# Patient Record
Sex: Male | Born: 1945 | Race: White | Hispanic: No | Marital: Single | Smoking: Former smoker
Health system: Southern US, Community
[De-identification: ages and names within clinical notes are randomized; demographics above are authoritative.]

## PROBLEM LIST (undated history)

## (undated) DIAGNOSIS — E119 Type 2 diabetes mellitus without complications: Secondary | ICD-10-CM

## (undated) DIAGNOSIS — I10 Essential (primary) hypertension: Secondary | ICD-10-CM

## (undated) HISTORY — PX: CATARACT EXTRACTION, BILATERAL: SHX1313

---

## 2011-04-17 ENCOUNTER — Emergency Department: Payer: Self-pay | Admitting: Emergency Medicine

## 2013-10-21 ENCOUNTER — Ambulatory Visit: Payer: Self-pay | Admitting: Gastroenterology

## 2015-12-25 ENCOUNTER — Emergency Department
Admission: EM | Admit: 2015-12-25 | Discharge: 2015-12-26 | Disposition: A | Discharge status: Home / Self Care | Payer: Medicare Other | Attending: Emergency Medicine | Admitting: Emergency Medicine

## 2015-12-25 ENCOUNTER — Encounter: Payer: Self-pay | Admitting: Emergency Medicine

## 2015-12-25 DIAGNOSIS — R0602 Shortness of breath: Secondary | ICD-10-CM | POA: Diagnosis not present

## 2015-12-25 DIAGNOSIS — R42 Dizziness and giddiness: Secondary | ICD-10-CM | POA: Diagnosis not present

## 2015-12-25 DIAGNOSIS — E119 Type 2 diabetes mellitus without complications: Secondary | ICD-10-CM | POA: Insufficient documentation

## 2015-12-25 DIAGNOSIS — Y939 Activity, unspecified: Secondary | ICD-10-CM | POA: Diagnosis not present

## 2015-12-25 DIAGNOSIS — W1800XA Striking against unspecified object with subsequent fall, initial encounter: Secondary | ICD-10-CM | POA: Diagnosis not present

## 2015-12-25 DIAGNOSIS — R531 Weakness: Secondary | ICD-10-CM | POA: Insufficient documentation

## 2015-12-25 DIAGNOSIS — S4991XA Unspecified injury of right shoulder and upper arm, initial encounter: Secondary | ICD-10-CM | POA: Diagnosis present

## 2015-12-25 DIAGNOSIS — S42291A Other displaced fracture of upper end of right humerus, initial encounter for closed fracture: Secondary | ICD-10-CM | POA: Diagnosis not present

## 2015-12-25 DIAGNOSIS — Z87891 Personal history of nicotine dependence: Secondary | ICD-10-CM | POA: Diagnosis not present

## 2015-12-25 DIAGNOSIS — Y999 Unspecified external cause status: Secondary | ICD-10-CM | POA: Diagnosis not present

## 2015-12-25 DIAGNOSIS — R05 Cough: Secondary | ICD-10-CM | POA: Insufficient documentation

## 2015-12-25 DIAGNOSIS — Y929 Unspecified place or not applicable: Secondary | ICD-10-CM | POA: Insufficient documentation

## 2015-12-25 DIAGNOSIS — I10 Essential (primary) hypertension: Secondary | ICD-10-CM | POA: Insufficient documentation

## 2015-12-25 HISTORY — DX: Type 2 diabetes mellitus without complications: E11.9

## 2015-12-25 HISTORY — DX: Essential (primary) hypertension: I10

## 2015-12-25 NOTE — ED Triage Notes (Signed)
Pt arrives from home by ems after falling. Pt reports having multiple falls since yesterday. 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 Pt's right eye is bruised and stitches intact. Pt's right arm in bruised and in a sling. Pt is alert and oriented x 4.

## 2015-12-26 ENCOUNTER — Emergency Department: Payer: Medicare Other

## 2015-12-26 LAB — URINALYSIS COMPLETE WITH MICROSCOPIC (ARMC ONLY)
BACTERIA UA: NONE SEEN
BILIRUBIN URINE: NEGATIVE
GLUCOSE, UA: 50 mg/dL — AB
Leukocytes, UA: NEGATIVE
Nitrite: NEGATIVE
Protein, ur: 100 mg/dL — AB
SPECIFIC GRAVITY, URINE: 1.017 (ref 1.005–1.030)
pH: 7 (ref 5.0–8.0)

## 2015-12-26 LAB — COMPREHENSIVE METABOLIC PANEL
ALBUMIN: 3.3 g/dL — AB (ref 3.5–5.0)
ALT: 19 U/L (ref 17–63)
AST: 23 U/L (ref 15–41)
Alkaline Phosphatase: 49 U/L (ref 38–126)
Anion gap: 3 — ABNORMAL LOW (ref 5–15)
BILIRUBIN TOTAL: 1.3 mg/dL — AB (ref 0.3–1.2)
BUN: 17 mg/dL (ref 6–20)
CO2: 33 mmol/L — ABNORMAL HIGH (ref 22–32)
CREATININE: 1.16 mg/dL (ref 0.61–1.24)
Calcium: 8.7 mg/dL — ABNORMAL LOW (ref 8.9–10.3)
Chloride: 100 mmol/L — ABNORMAL LOW (ref 101–111)
GFR calc Af Amer: >60 mL/min (ref >60)
GLUCOSE: 108 mg/dL — AB (ref 65–99)
POTASSIUM: 3.4 mmol/L — AB (ref 3.5–5.1)
Sodium: 136 mmol/L (ref 135–145)
TOTAL PROTEIN: 6.8 g/dL (ref 6.5–8.1)

## 2015-12-26 LAB — CBC
HEMATOCRIT: 38.9 % — AB (ref 40.0–52.0)
HEMOGLOBIN: 13.6 g/dL (ref 13.0–18.0)
MCH: 30.6 pg (ref 26.0–34.0)
MCHC: 34.9 g/dL (ref 32.0–36.0)
MCV: 87.6 fL (ref 80.0–100.0)
Platelets: 127 10*3/uL — ABNORMAL LOW (ref 150–440)
RBC: 4.44 MIL/uL (ref 4.40–5.90)
RDW: 14.2 % (ref 11.5–14.5)
WBC: 8.6 10*3/uL (ref 3.8–10.6)

## 2015-12-26 LAB — TROPONIN I: Troponin I: <0.03 ng/mL (ref <0.03)

## 2015-12-26 MED ORDER — SODIUM CHLORIDE 0.9 % IV BOLUS (SEPSIS)
1000.0000 mL | Freq: Once | INTRAVENOUS | Status: AC
  Administered 2015-12-26: 1000 mL via INTRAVENOUS

## 2015-12-26 NOTE — ED Notes (Signed)
Pt repositioned and given sprite. No other needs expressed.

## 2015-12-26 NOTE — ED Notes (Signed)
Pt ambulated in room and to bathroom without difficulty.

## 2015-12-26 NOTE — ED Provider Notes (Signed)
[facility] Emergency Department Provider Note   ____________________________________________   First MD Initiated Contact with Patient 12/26/15 0014     (approximate)  I have reviewed the triage vital signs and the nursing notes.   HISTORY  Chief Complaint Fall    HPI Adam Irwin is a 70 y.o. male who comes into the hospital today with multiple falls. The patient reports that he was stumbling and falling often today. He states that his mom is unable to care for him and she is 52. He was told it was best for him to come in somewhere where he can be taking care of him until he is better. The patient fell and broke his arm on Saturday and [place]. He was kept overnight at the hospital there to ensure that he didn't have a concussion. He reports that there is no plan for follow-up as they want him to come back to [facility] [place] but he does not want to drive there. He reports that he slid out of the chair onto the floor a few times but couldn't get up due to his broken arm. He reports that he's also had some chest pain and a mild cough for the last few hours. He also has some mild shortness of breath. He called the [place] today but did not receive a call back the patient is here for evaluation   Past Medical History:  Diagnosis Date  . Diabetes mellitus without complication (HCC)   . Hypertension     There are no active problems to display for this patient.   Past Surgical History:  Procedure Laterality Date  . CATARACT EXTRACTION, BILATERAL      Prior to Admission medications   Not on File    Allergies Bee venom and Penicillins  No family history on file.  Social History Social History  Substance Use Topics  . Smoking status: Former Games developer  . Smokeless tobacco: Former Neurosurgeon  . Alcohol use No    Review of Systems Constitutional: No fever/chills Eyes: No visual changes. ENT: No sore throat. Cardiovascular:  chest  pain. Respiratory: cough and shortness of breath. Gastrointestinal: No abdominal pain.  No nausea, no vomiting.  No diarrhea.  No constipation. Genitourinary: Negative for dysuria. Musculoskeletal: Negative for back pain. Skin: Negative for rash. Neurological: Negative for headaches, focal weakness or numbness.  10-point ROS otherwise negative.  ____________________________________________   PHYSICAL EXAM:  VITAL SIGNS: ED Triage Vitals  Enc Vitals Group     BP 12/25/15 2346 (!) 176/102     Pulse Rate 12/25/15 2346 (!) 112     Resp 12/25/15 2346 (!) 24     Temp 12/25/15 2346 99.1 F (37.3 C)     Temp Source 12/25/15 2346 Oral     SpO2 12/25/15 2346 95 %     Weight 12/25/15 2352 200 lb (90.7 kg)     Height 12/25/15 2352 5\' 7"  (1.702 m)     Head Circumference --      Peak Flow --      Pain Score 12/25/15 2353 3     Pain Loc --      Pain Edu? --      Excl. in GC? --     Constitutional: Alert and oriented. Well appearing and in no acute distress. Eyes: Conjunctivae are normal. PERRL. EOMI. Head: Atraumatic. Nose: No congestion/rhinnorhea. Mouth/Throat: Mucous membranes are moist.  Oropharynx non-erythematous. Cardiovascular: Normal rate, regular rhythm. Grossly normal heart sounds.  Good  peripheral circulation. Respiratory: Normal respiratory effort.  No retractions. Lungs CTAB. Gastrointestinal: Soft and nontender. No distention. Positive bowel sounds Musculoskeletal: No lower extremity tenderness nor edema.  Neurologic:  Normal speech and language. Cranial nerves II through XII are grossly intact with no focal motor or neuro deficits Skin:  Skin is warm, dry and intact.  Psychiatric: Mood and affect are normal.   ____________________________________________   LABS (all labs ordered are listed, but only abnormal results are displayed)  Labs Reviewed  CBC - Abnormal; Notable for the following:       Result Value   HCT 38.9 (*)    Platelets 127 (*)    All other  components within normal limits  COMPREHENSIVE METABOLIC PANEL - Abnormal; Notable for the following:    Potassium 3.4 (*)    Chloride 100 (*)    CO2 33 (*)    Glucose, Bld 108 (*)    Calcium 8.7 (*)    Albumin 3.3 (*)    Total Bilirubin 1.3 (*)    Anion gap 3 (*)    All other components within normal limits  URINALYSIS COMPLETEWITH MICROSCOPIC ([facility] ONLY) - Abnormal; Notable for the following:    Color, Urine YELLOW (*)    APPearance CLEAR (*)    Glucose, UA 50 (*)    Ketones, ur TRACE (*)    Hgb urine dipstick 1+ (*)    Protein, ur 100 (*)    Squamous Epithelial / LPF 0-5 (*)    All other components within normal limits  TROPONIN I  TROPONIN I   ____________________________________________  EKG  ED ECG REPORT I, Rebecka ApleyWebster,  Jeanann Balinski P, the attending physician, personally viewed and interpreted this ECG.   Date: 12/25/2015  EKG Time: 2358  Rate: 110  Rhythm: sinus tachycardia  Axis: normal  Intervals:none  ST&T Change: none  ____________________________________________  RADIOLOGY  Chest x-ray CT head ____________________________________________   PROCEDURES  Procedure(s) performed: None  Procedures  Critical Care performed: No  ____________________________________________   INITIAL IMPRESSION / ASSESSMENT AND PLAN / ED COURSE  Pertinent labs & imaging results that were available during my care of the patient were reviewed by me and considered in my medical decision making (see chart for details).  This is a 70 year old male who comes into the hospital today with some unsteadiness, dizziness, falls at home. I will give the patient a liter of normal saline as he is tachycardic and likely has some mild dehydration. I will evaluate the patient with some blood work and a chest x-ray and CT head to determine if there is a cause for his symptoms. I will then reassess the patient.  Clinical Course  Value Comment By Time  DG Chest 2 View No active  cardiopulmonary disease. Rebecka ApleyAllison P Avraham Benish, MD 10/03 0132  CT Head Wo Contrast No acute intracranial abnormalities. Rebecka ApleyAllison P Tasha Diaz, MD 10/03 0423    The patient's imaging studies are negative and his blood work is unremarkable. I did give the patient a liter of normal saline while in the emergency department. He was sleeping while he was here. He is looking for rehabilitation. Due to his insurance he does not qualify for rehabilitation placement nor home health care. Our care manager does feel that the best [facility] for the [facility]. I discussed this with the patient after keeping him an effort to discuss with care management. The patient understands and he  [facility]. He will be discharged home. He was able to walk overnight with a steady gait he did eat and drink as well. ____________________________________________   FINAL CLINICAL IMPRESSION(S) / ED DIAGNOSES  Final diagnoses:  Weakness  Dizziness  Other closed displaced fracture of proximal end of right humerus, initial encounter      NEW MEDICATIONS STARTED DURING THIS VISIT:  New Prescriptions   No medications on file     Note:  This document was prepared using Dragon voice recognition software and may include unintentional dictation errors.    Rebecka Apley, MD 12/26/15 [phone]

## 2017-11-24 ENCOUNTER — Emergency Department (HOSPITAL_COMMUNITY): Payer: Medicare Other

## 2017-11-24 ENCOUNTER — Other Ambulatory Visit: Payer: Self-pay

## 2017-11-24 ENCOUNTER — Emergency Department (HOSPITAL_COMMUNITY)
Admission: EM | Admit: 2017-11-24 | Discharge: 2017-11-25 | Disposition: A | Discharge status: Home / Self Care | Payer: Medicare Other | Attending: Emergency Medicine | Admitting: Emergency Medicine

## 2017-11-24 ENCOUNTER — Encounter (HOSPITAL_COMMUNITY): Payer: Self-pay | Admitting: Radiology

## 2017-11-24 DIAGNOSIS — W108XXA Fall (on) (from) other stairs and steps, initial encounter: Secondary | ICD-10-CM | POA: Diagnosis not present

## 2017-11-24 DIAGNOSIS — I10 Essential (primary) hypertension: Secondary | ICD-10-CM | POA: Insufficient documentation

## 2017-11-24 DIAGNOSIS — S3991XA Unspecified injury of abdomen, initial encounter: Secondary | ICD-10-CM | POA: Diagnosis not present

## 2017-11-24 DIAGNOSIS — Z7982 Long term (current) use of aspirin: Secondary | ICD-10-CM | POA: Diagnosis not present

## 2017-11-24 DIAGNOSIS — Z79899 Other long term (current) drug therapy: Secondary | ICD-10-CM | POA: Diagnosis not present

## 2017-11-24 DIAGNOSIS — R42 Dizziness and giddiness: Secondary | ICD-10-CM | POA: Diagnosis not present

## 2017-11-24 DIAGNOSIS — E875 Hyperkalemia: Secondary | ICD-10-CM

## 2017-11-24 DIAGNOSIS — E119 Type 2 diabetes mellitus without complications: Secondary | ICD-10-CM | POA: Diagnosis not present

## 2017-11-24 DIAGNOSIS — S299XXA Unspecified injury of thorax, initial encounter: Secondary | ICD-10-CM | POA: Diagnosis present

## 2017-11-24 DIAGNOSIS — R531 Weakness: Secondary | ICD-10-CM | POA: Insufficient documentation

## 2017-11-24 DIAGNOSIS — Z7984 Long term (current) use of oral hypoglycemic drugs: Secondary | ICD-10-CM | POA: Diagnosis not present

## 2017-11-24 DIAGNOSIS — Y999 Unspecified external cause status: Secondary | ICD-10-CM | POA: Insufficient documentation

## 2017-11-24 DIAGNOSIS — Y92018 Other place in single-family (private) house as the place of occurrence of the external cause: Secondary | ICD-10-CM | POA: Diagnosis not present

## 2017-11-24 DIAGNOSIS — S2231XA Fracture of one rib, right side, initial encounter for closed fracture: Secondary | ICD-10-CM | POA: Diagnosis not present

## 2017-11-24 DIAGNOSIS — R52 Pain, unspecified: Secondary | ICD-10-CM

## 2017-11-24 DIAGNOSIS — Z87891 Personal history of nicotine dependence: Secondary | ICD-10-CM | POA: Insufficient documentation

## 2017-11-24 DIAGNOSIS — Y9389 Activity, other specified: Secondary | ICD-10-CM | POA: Insufficient documentation

## 2017-11-24 DIAGNOSIS — Y92009 Unspecified place in unspecified non-institutional (private) residence as the place of occurrence of the external cause: Secondary | ICD-10-CM

## 2017-11-24 DIAGNOSIS — W19XXXA Unspecified fall, initial encounter: Secondary | ICD-10-CM

## 2017-11-24 LAB — CBC WITH DIFFERENTIAL/PLATELET
Abs Immature Granulocytes: 0 10*3/uL (ref 0.0–0.1)
BASOS PCT: 1 %
Basophils Absolute: 0.1 10*3/uL (ref 0.0–0.1)
EOS ABS: 0.1 10*3/uL (ref 0.0–0.7)
Eosinophils Relative: 1 %
HCT: 36.7 % — ABNORMAL LOW (ref 39.0–52.0)
Hemoglobin: 12.2 g/dL — ABNORMAL LOW (ref 13.0–17.0)
IMMATURE GRANULOCYTES: 1 %
Lymphocytes Relative: 13 %
Lymphs Abs: 1.1 10*3/uL (ref 0.7–4.0)
MCH: 29.5 pg (ref 26.0–34.0)
MCHC: 33.2 g/dL (ref 30.0–36.0)
MCV: 88.6 fL (ref 78.0–100.0)
MONOS PCT: 6 %
Monocytes Absolute: 0.5 10*3/uL (ref 0.1–1.0)
NEUTROS PCT: 78 %
Neutro Abs: 6.4 10*3/uL (ref 1.7–7.7)
PLATELETS: 194 10*3/uL (ref 150–400)
RBC: 4.14 MIL/uL — ABNORMAL LOW (ref 4.22–5.81)
RDW: 12.8 % (ref 11.5–15.5)
WBC: 8.1 10*3/uL (ref 4.0–10.5)

## 2017-11-24 LAB — COMPREHENSIVE METABOLIC PANEL
ALT: 33 U/L (ref 0–44)
AST: 28 U/L (ref 15–41)
Albumin: 3.3 g/dL — ABNORMAL LOW (ref 3.5–5.0)
Alkaline Phosphatase: 43 U/L (ref 38–126)
Anion gap: 7 (ref 5–15)
BUN: 14 mg/dL (ref 8–23)
CHLORIDE: 103 mmol/L (ref 98–111)
CO2: 26 mmol/L (ref 22–32)
Calcium: 9.1 mg/dL (ref 8.9–10.3)
Creatinine, Ser: 1.65 mg/dL — ABNORMAL HIGH (ref 0.61–1.24)
GFR, EST AFRICAN AMERICAN: 47 mL/min — AB (ref >60)
GFR, EST NON AFRICAN AMERICAN: 40 mL/min — AB (ref >60)
Glucose, Bld: 172 mg/dL — ABNORMAL HIGH (ref 70–99)
POTASSIUM: 5.3 mmol/L — AB (ref 3.5–5.1)
Sodium: 136 mmol/L (ref 135–145)
Total Bilirubin: 0.8 mg/dL (ref 0.3–1.2)
Total Protein: 6 g/dL — ABNORMAL LOW (ref 6.5–8.1)

## 2017-11-24 LAB — URINALYSIS, ROUTINE W REFLEX MICROSCOPIC
Bilirubin Urine: NEGATIVE
Glucose, UA: 50 mg/dL — AB
HGB URINE DIPSTICK: NEGATIVE
KETONES UR: NEGATIVE mg/dL
LEUKOCYTES UA: NEGATIVE
Nitrite: NEGATIVE
PROTEIN: NEGATIVE mg/dL
Specific Gravity, Urine: 1.033 — ABNORMAL HIGH (ref 1.005–1.030)
pH: 7 (ref 5.0–8.0)

## 2017-11-24 LAB — I-STAT CHEM 8, ED
BUN: 16 mg/dL (ref 8–23)
Calcium, Ion: 1.12 mmol/L — ABNORMAL LOW (ref 1.15–1.40)
Chloride: 99 mmol/L (ref 98–111)
Creatinine, Ser: 1.6 mg/dL — ABNORMAL HIGH (ref 0.61–1.24)
Glucose, Bld: 173 mg/dL — ABNORMAL HIGH (ref 70–99)
HEMATOCRIT: 34 % — AB (ref 39.0–52.0)
HEMOGLOBIN: 11.6 g/dL — AB (ref 13.0–17.0)
POTASSIUM: 5 mmol/L (ref 3.5–5.1)
Sodium: 133 mmol/L — ABNORMAL LOW (ref 135–145)
TCO2: 24 mmol/L (ref 22–32)

## 2017-11-24 LAB — I-STAT TROPONIN, ED: TROPONIN I, POC: 0 ng/mL (ref 0.00–0.08)

## 2017-11-24 LAB — CBG MONITORING, ED: Glucose-Capillary: 149 mg/dL — ABNORMAL HIGH (ref 70–99)

## 2017-11-24 MED ORDER — HYDROCODONE-ACETAMINOPHEN 5-325 MG PO TABS
1.0000 | ORAL_TABLET | Freq: Once | ORAL | Status: AC
  Administered 2017-11-24: 1 via ORAL
  Filled 2017-11-24: qty 1

## 2017-11-24 MED ORDER — FENTANYL CITRATE (PF) 100 MCG/2ML IJ SOLN
50.0000 ug | Freq: Once | INTRAMUSCULAR | Status: DC
  Filled 2017-11-24: qty 2

## 2017-11-24 MED ORDER — SODIUM CHLORIDE 0.9 % IV BOLUS
500.0000 mL | Freq: Once | INTRAVENOUS | Status: AC
  Administered 2017-11-24: 500 mL via INTRAVENOUS

## 2017-11-24 MED ORDER — HYDROCODONE-ACETAMINOPHEN 5-325 MG PO TABS: 1.0000 | ORAL_TABLET | Freq: Four times a day (QID) | ORAL | 0 refills | Status: DC | PRN

## 2017-11-24 MED ORDER — FENTANYL CITRATE (PF) 100 MCG/2ML IJ SOLN
25.0000 ug | Freq: Once | INTRAMUSCULAR | Status: DC
  Filled 2017-11-24: qty 2

## 2017-11-24 MED ORDER — IOPAMIDOL (ISOVUE-300) INJECTION 61%
INTRAVENOUS | Status: AC
  Filled 2017-11-24: qty 100

## 2017-11-24 MED ORDER — IOPAMIDOL (ISOVUE-300) INJECTION 61%
100.0000 mL | Freq: Once | INTRAVENOUS | Status: AC | PRN
  Administered 2017-11-24: 100 mL via INTRAVENOUS

## 2017-11-24 NOTE — ED Notes (Signed)
Went in room to get orthostatic vitals. Pt stated he would try. Obtained lying orthostatic vitals. Pt stated he was in to much pain to sit on side of bed or stand up. Pt stated he would try again after he ate.

## 2017-11-24 NOTE — ED Provider Notes (Signed)
[facility] EMERGENCY DEPARTMENT Provider Note   CSN: [id number] Arrival date & time: 11/24/17  1539     History   Chief Complaint Chief Complaint  Patient presents with  . Fall    HPI Adam Irwin is a 72 y.o. male with past medical history of DM 2, hypertension, who presents today for evaluation after a fall.  He reports that he was bringing groceries in when he tripped over his own feet causing him to fall.  He denies striking his head, denies any head pain or neck pain.  With EMS he was reportedly orthostatic, upon standing his blood pressure dropped to 80/50 with diaphoresis and dizziness that resolved with sitting down.  He denies any medication changes.  Denies any chest pain, headache, shortness of breath, nausea abdominal pain or any other prodromal symptoms.  He currently reports that the right side of his chest hurts where he hit it when he fell.  He did not have any pain meds in route by EMS.  His pain is worsened with movement and deep breathing.  HPI  Past Medical History:  Diagnosis Date  . Diabetes mellitus without complication (HCC)   . Hypertension     There are no active problems to display for this patient.   Past Surgical History:  Procedure Laterality Date  . CATARACT EXTRACTION, BILATERAL          Home Medications    Prior to Admission medications   Medication Sig Start Date End Date Taking? Authorizing Provider  albuterol (PROVENTIL HFA;VENTOLIN HFA) 108 (90 Base) MCG/ACT inhaler Inhale 2 puffs into the lungs every 6 (six) hours as needed for wheezing or shortness of breath.   Yes [provider]  amitriptyline (ELAVIL) 25 MG tablet Take 50 mg by mouth at bedtime. For pain   Yes [provider]  amLODipine (NORVASC) 10 MG tablet Take 10 mg by mouth daily. For blood pressure   Yes [provider]  aspirin 81 MG chewable tablet Chew 81 mg by mouth daily. For heart protection   Yes [provider]  calcipotriene (DOVONOX) 0.005 % cream Apply 1 application topically See admin instructions. Apply sparingly topically two times a day as needed for rash, apply to active lesions only   Yes [provider]  carboxymethylcellulose (REFRESH PLUS) 0.5 % SOLN Place 1 drop into both eyes 4 (four) times daily.   Yes [provider]  DULoxetine (CYMBALTA) 60 MG capsule Take 60 mg by mouth daily.   Yes [provider]  EPINEPHrine (EPIPEN 2-PAK) 0.3 mg/0.3 mL IJ SOAJ injection Inject 0.3 mg into the muscle once as needed (severe allergic reaction).   Yes [provider]  glipiZIDE (GLUCOTROL) 5 MG tablet Take 5 mg by mouth 2 (two) times daily before a meal.   Yes [provider]  hydrocerin (EUCERIN) CREA Apply 1 application topically daily. Apply liberal amount topically to dry skin after shower every day   Yes [provider]  ketoconazole (NIZORAL) 2 % shampoo Apply 1 application topically See admin instructions. Apply topically to chest and back and then rinse off - Monday, Wednesday, Friday   Yes [provider]  levothyroxine (SYNTHROID, LEVOTHROID) 150 MCG tablet Take 150 mcg by mouth See admin instructions. Take one tablet (150 mcg) by mouth on Monday, Tuesday, Wednesday, Thursday, Friday (take 175 mcg on Saturday and Sunday) - for thyroid   Yes [provider]  levothyroxine (SYNTHROID, LEVOTHROID) 175 MCG tablet Take  175 mcg by mouth See admin instructions. Take one tablet (175 mcg) by mouth every Saturday and Sunday (take 150 mcg Monday thru Friday) - for thyroid   Yes [provider]  lisinopril (PRINIVIL,ZESTRIL) 40 MG tablet Take 40 mg by mouth daily. For blood pressure   Yes [provider]  loratadine (CLARITIN) 10 MG tablet Take 10 mg by mouth daily.    Yes [provider]  Melatonin 3 MG TABS Take 3 mg by mouth at bedtime.   Yes [provider]  metFORMIN (GLUCOPHAGE) 1000  MG tablet Take 1,000 mg by mouth 2 (two) times daily with a meal. For diabetes   Yes [provider]  metoprolol tartrate (LOPRESSOR) 50 MG tablet Take 50 mg by mouth 2 (two) times daily. For blood pressure   Yes [provider]  mometasone (ASMANEX, 120 METERED DOSES,) 220 MCG/INH inhaler Inhale 1 puff into the lungs daily.    Yes [provider]  omeprazole (PRILOSEC) 20 MG capsule Take 20 mg by mouth daily before breakfast. To control stomach acid   Yes [provider]  pioglitazone (ACTOS) 45 MG tablet Take 45 mg by mouth daily. For diabetes mellitus   Yes [provider]  Pramoxine-HC (HYDROCORTISONE ACE-PRAMOXINE EX) Apply 1 application topically 2 (two) times daily as needed (itchy spots). 2.5%/1% ointment per [facility]   Yes [provider]  simvastatin (ZOCOR) 40 MG tablet Take 20 mg by mouth at bedtime. For cholesterol   Yes [provider]  Tiotropium Bromide-Olodaterol (STIOLTO RESPIMAT) 2.5-2.5 MCG/ACT AERS Inhale 2 puffs into the lungs at bedtime.    Yes [provider]  HYDROcodone-acetaminophen (NORCO/VICODIN) 5-325 MG tablet Take 1 tablet by mouth every 6 (six) hours as needed. 11/24/17   Cristina Gong, PA-C    Family History No family history on file.  Social History Social History   Tobacco Use  . Smoking status: Former Games developer  . Smokeless tobacco: Former Engineer, water Use Topics  . Alcohol use: No  . Drug use: Not on file     Allergies   Bee venom and Penicillins   Review of Systems Review of Systems  Constitutional: Negative for chills, fatigue and fever.  HENT: Negative for congestion.   Respiratory: Negative for shortness of breath.   Cardiovascular: Positive for chest pain (Only after falling).  Gastrointestinal: Positive for abdominal pain. Negative for diarrhea, nausea and vomiting.  Genitourinary: Positive for flank pain.  Musculoskeletal: Positive for back pain. Negative for neck  pain.  Skin: Negative for wound.  Neurological: Positive for weakness and light-headedness. Negative for dizziness, syncope, speech difficulty and headaches.  All other systems reviewed and are negative.    Physical Exam Updated Vital Signs BP (!) 114/93 (BP Location: Right Arm)   Pulse (!) 104   Temp 98.5 F (36.9 C) (Oral)   Resp 18   Ht 5\' 7"  (1.702 m)   Wt 83.9 kg   SpO2 96%   BMI 28.98 kg/m   Physical Exam  Constitutional: He is oriented to person, place, and time. He appears well-developed and well-nourished.  HENT:  Head: Normocephalic and atraumatic.  Mouth/Throat: Oropharynx is clear and moist.  Eyes: Pupils are equal, round, and reactive to light. Conjunctivae are normal.  Neck:  No midline tenderness to palpation.  Trachea is not deviated.  Cardiovascular: Normal rate, regular rhythm, normal heart sounds and intact distal pulses.  No murmur heard. Pulmonary/Chest: Effort normal and breath sounds normal. No respiratory distress. He  exhibits tenderness.  There is tenderness to palpation over the right lateral chest wall.  There is no obvious crepitus.  Lung sounds are slightly decreased over the right lower fields.  There is ecchymosis over the right posterior lower chest/upper abdomen.  Abdominal: Soft. Bowel sounds are normal. He exhibits no distension. There is no tenderness. There is no guarding.  Musculoskeletal: He exhibits no edema or deformity.  Is able to elevate bilateral legs off the bed.  Neurological: He is alert and oriented to person, place, and time.  Skin: Skin is warm and dry.  Psychiatric: He has a normal mood and affect.  Nursing note and vitals reviewed.    ED Treatments / Results  Labs (all labs ordered are listed, but only abnormal results are displayed) Labs Reviewed  CBC WITH DIFFERENTIAL/PLATELET - Abnormal; Notable for the following components:      Result Value   RBC 4.14 (*)    Hemoglobin 12.2 (*)    HCT 36.7 (*)    All other  components within normal limits  COMPREHENSIVE METABOLIC PANEL - Abnormal; Notable for the following components:   Potassium 5.3 (*)    Glucose, Bld 172 (*)    Creatinine, Ser 1.65 (*)    Total Protein 6.0 (*)    Albumin 3.3 (*)    GFR calc non Af Amer 40 (*)    GFR calc Af Amer 47 (*)    All other components within normal limits  URINALYSIS, ROUTINE W REFLEX MICROSCOPIC - Abnormal; Notable for the following components:   Specific Gravity, Urine 1.033 (*)    Glucose, UA 50 (*)    All other components within normal limits  CBG MONITORING, ED - Abnormal; Notable for the following components:   Glucose-Capillary 149 (*)    All other components within normal limits  I-STAT CHEM 8, ED - Abnormal; Notable for the following components:   Sodium 133 (*)    Creatinine, Ser 1.60 (*)    Glucose, Bld 173 (*)    Calcium, Ion 1.12 (*)    Hemoglobin 11.6 (*)    HCT 34.0 (*)    All other components within normal limits  I-STAT TROPONIN, ED    EKG EKG Interpretation  Date/Time:  Monday November 24 2017 16:19:54 EDT Ventricular Rate:  68 PR Interval:  170 QRS Duration: 84 QT Interval:  416 QTC Calculation: 442 R Axis:   57 Text Interpretation:  Normal sinus rhythm no acute ST/T changes tachycardia resolved compared to 2017 Confirmed by Pricilla Loveless [phone]) on 11/24/2017 4:40:25 PM   Radiology Dg Chest 1 View  Result Date: 11/24/2017 CLINICAL DATA:  Fall with right-sided rib pain EXAM: CHEST  1 VIEW COMPARISON:  12/26/2015 FINDINGS: No acute consolidation or effusion. Borderline cardiomegaly with aortic atherosclerosis. No pneumothorax. Partially visualized old fracture deformity of the proximal right humerus. IMPRESSION: No active disease. Electronically Signed   By: Jasmine Pang M.D.   On: 11/24/2017 16:49   Ct Head Wo Contrast  Result Date: 11/24/2017 CLINICAL DATA:  72 year old male with history of trauma from a fall off of this porch landing onto the right side. Right-sided  shoulder pain. No definite head injury. EXAM: CT HEAD WITHOUT CONTRAST CT CERVICAL SPINE WITHOUT CONTRAST TECHNIQUE: Multidetector CT imaging of the head and cervical spine was performed following the standard protocol without intravenous contrast. Multiplanar CT image reconstructions of the cervical spine were also generated. COMPARISON:  Head CT 12/26/2015. FINDINGS: CT HEAD FINDINGS Brain: No evidence of acute infarction, hemorrhage,  hydrocephalus, extra-axial collection or mass lesion/mass effect. Vascular: No hyperdense vessel or unexpected calcification. Skull: Normal. Negative for fracture or focal lesion. Sinuses/Orbits: No acute finding. Other: None. CT CERVICAL SPINE FINDINGS Alignment: Normal. Skull base and vertebrae: No acute fracture. No primary bone lesion or focal pathologic process. Soft tissues and spinal canal: No prevertebral fluid or swelling. No visible canal hematoma. Disc levels: Multilevel degenerative disc disease, most pronounced at C5-C6. Mild multilevel facet arthropathy. Upper chest: Unremarkable. Other: None. IMPRESSION: 1. No evidence of significant acute traumatic injury to the skull, brain or cervical spine. 2. The appearance of the brain is normal. 3. Mild multilevel degenerative disc disease and cervical spondylosis. Electronically Signed   By: Trudie Reed M.D.   On: 11/24/2017 18:03   Ct Chest W Contrast  Result Date: 11/24/2017 CLINICAL DATA:  Larey Seat from porch and landed on right side, right shoulder pain and arm pain EXAM: CT CHEST, ABDOMEN, AND PELVIS WITH CONTRAST TECHNIQUE: Multidetector CT imaging of the chest, abdomen and pelvis was performed following the standard protocol during bolus administration of intravenous contrast. CONTRAST:  ISOVUE-300 IOPAMIDOL (ISOVUE-300) INJECTION 61% COMPARISON:  Radiograph 11/24/2017 FINDINGS: CT CHEST FINDINGS Cardiovascular: Mild aortic atherosclerosis. No aneurysm. Heart size upper normal. No pericardial effusion  Mediastinum/Nodes: Negative for mediastinal hematoma. No thyroid mass. Midline trachea. No significant adenopathy. Esophagus within normal limits Lungs/Pleura: No pneumothorax. Subsegmental atelectasis in the right lower lobe. No significant pleural effusion. No focal consolidations. Minimal right posterior pleural thickening. Musculoskeletal: Acute nondisplaced right seventh posterior rib fracture. Sternum is intact. CT ABDOMEN PELVIS FINDINGS Hepatobiliary: No focal liver abnormality is seen. No gallstones, gallbladder wall thickening, or biliary dilatation. Pancreas: Unremarkable. No pancreatic ductal dilatation or surrounding inflammatory changes. Spleen: Normal in size without focal abnormality. Adrenals/Urinary Tract: Adrenal glands are within normal limits. No hydronephrosis. Bladder normal. Stomach/Bowel: Stomach is within normal limits. Appendix appears normal. No evidence of bowel wall thickening, distention, or inflammatory changes. Vascular/Lymphatic: Mild aortic atherosclerosis. No aneurysm. No significant adenopathy. Reproductive: Prostate is unremarkable. Other: Negative for free air or free fluid. Fat within the left greater than right inguinal canal. Fat in the umbilical region. Musculoskeletal: Degenerative changes. No acute or suspicious abnormality. IMPRESSION: 1. Acute nondisplaced right posterior seventh rib fracture with adjacent mild pleural thickening but no pneumothorax or significant pleural effusion. 2. Negative for acute mediastinal injury. 3. No CT evidence for acute intra-abdominal or pelvic abnormality. Electronically Signed   By: Jasmine Pang M.D.   On: 11/24/2017 18:09   Ct Cervical Spine Wo Contrast  Result Date: 11/24/2017 CLINICAL DATA:  72 year old male with history of trauma from a fall off of this porch landing onto the right side. Right-sided shoulder pain. No definite head injury. EXAM: CT HEAD WITHOUT CONTRAST CT CERVICAL SPINE WITHOUT CONTRAST TECHNIQUE: Multidetector  CT imaging of the head and cervical spine was performed following the standard protocol without intravenous contrast. Multiplanar CT image reconstructions of the cervical spine were also generated. COMPARISON:  Head CT 12/26/2015. FINDINGS: CT HEAD FINDINGS Brain: No evidence of acute infarction, hemorrhage, hydrocephalus, extra-axial collection or mass lesion/mass effect. Vascular: No hyperdense vessel or unexpected calcification. Skull: Normal. Negative for fracture or focal lesion. Sinuses/Orbits: No acute finding. Other: None. CT CERVICAL SPINE FINDINGS Alignment: Normal. Skull base and vertebrae: No acute fracture. No primary bone lesion or focal pathologic process. Soft tissues and spinal canal: No prevertebral fluid or swelling. No visible canal hematoma. Disc levels: Multilevel degenerative disc disease, most pronounced at C5-C6. Mild multilevel facet arthropathy.  Upper chest: Unremarkable. Other: None. IMPRESSION: 1. No evidence of significant acute traumatic injury to the skull, brain or cervical spine. 2. The appearance of the brain is normal. 3. Mild multilevel degenerative disc disease and cervical spondylosis. Electronically Signed   By: Trudie Reed M.D.   On: 11/24/2017 18:03   Ct Abdomen Pelvis W Contrast  Result Date: 11/24/2017 CLINICAL DATA:  Larey Seat from porch and landed on right side, right shoulder pain and arm pain EXAM: CT CHEST, ABDOMEN, AND PELVIS WITH CONTRAST TECHNIQUE: Multidetector CT imaging of the chest, abdomen and pelvis was performed following the standard protocol during bolus administration of intravenous contrast. CONTRAST:  ISOVUE-300 IOPAMIDOL (ISOVUE-300) INJECTION 61% COMPARISON:  Radiograph 11/24/2017 FINDINGS: CT CHEST FINDINGS Cardiovascular: Mild aortic atherosclerosis. No aneurysm. Heart size upper normal. No pericardial effusion Mediastinum/Nodes: Negative for mediastinal hematoma. No thyroid mass. Midline trachea. No significant adenopathy. Esophagus within  normal limits Lungs/Pleura: No pneumothorax. Subsegmental atelectasis in the right lower lobe. No significant pleural effusion. No focal consolidations. Minimal right posterior pleural thickening. Musculoskeletal: Acute nondisplaced right seventh posterior rib fracture. Sternum is intact. CT ABDOMEN PELVIS FINDINGS Hepatobiliary: No focal liver abnormality is seen. No gallstones, gallbladder wall thickening, or biliary dilatation. Pancreas: Unremarkable. No pancreatic ductal dilatation or surrounding inflammatory changes. Spleen: Normal in size without focal abnormality. Adrenals/Urinary Tract: Adrenal glands are within normal limits. No hydronephrosis. Bladder normal. Stomach/Bowel: Stomach is within normal limits. Appendix appears normal. No evidence of bowel wall thickening, distention, or inflammatory changes. Vascular/Lymphatic: Mild aortic atherosclerosis. No aneurysm. No significant adenopathy. Reproductive: Prostate is unremarkable. Other: Negative for free air or free fluid. Fat within the left greater than right inguinal canal. Fat in the umbilical region. Musculoskeletal: Degenerative changes. No acute or suspicious abnormality. IMPRESSION: 1. Acute nondisplaced right posterior seventh rib fracture with adjacent mild pleural thickening but no pneumothorax or significant pleural effusion. 2. Negative for acute mediastinal injury. 3. No CT evidence for acute intra-abdominal or pelvic abnormality. Electronically Signed   By: Jasmine Pang M.D.   On: 11/24/2017 18:09    Procedures Procedures (including critical care time)  Medications Ordered in ED Medications  iopamidol (ISOVUE-300) 61 % injection (has no administration in time range)  fentaNYL (SUBLIMAZE) injection 25 mcg (25 mcg Intravenous Not Given 11/24/17 2302)  sodium chloride 0.9 % bolus 500 mL (0 mLs Intravenous Stopped 11/24/17 1719)  iopamidol (ISOVUE-300) 61 % injection 100 mL (100 mLs Intravenous Contrast Given 11/24/17 1654)    HYDROcodone-acetaminophen (NORCO/VICODIN) 5-325 MG per tablet 1 tablet (1 tablet Oral Given 11/24/17 1905)     Initial Impression / Assessment and Plan / ED Course  I have reviewed the triage vital signs and the nursing notes.  Pertinent labs & imaging results that were available during my care of the patient were reviewed by me and considered in my medical decision making (see chart for details).  Clinical Course as of Nov 26 143  Mon Nov 24, 2017  1642 Received a call from radiology the patient is unable to sit up for lateral view secondary to pain.  Pain meds ordered.   [EH]  2038 Patient reevaluated, he has had Norco.  He expresses upset at orthostatic vitals.  The purpose of this was explained to him and he states his understanding.   [EH]  2258 While discharging patient attempt to prescribe narcotics electronically.  Collect the sign button however would not proceed to additional forms of authentication, fingerprint or password were not given.  This process was again repeated  without success.  Therefore paper prescription printed.   [EH]    Clinical Course User Index [EH] Cristina Gong, PA-C   Lewis Moccasin Larke presents today for evaluation after a mechanical fall porch striking his right side.  On initial exam he had tenderness on the right chest along with concern for possibly decreased sounds.  Based on the area, concern for both chest and abdominal injury.  Also given patient's age, and concern for distracting injuries CT head and neck were also ordered.  Patient's initial pressure was soft at 96/58, heart rate was in the 90s.  He did not have any prodromal symptoms.  He is did become tachycardic while in the department in the low 100.  His 1 view chest x-ray did not show any pneumothorax or acute abnormalities.  CT chest, abdomen, and pelvis showed a nondisplaced right sided posterior rib without pneumothorax.  CT head and neck showed degenerative changes without acute  abnormality.  Patient was observed in the emergency room for multiple hours.  Orthostatic vitals were unremarkable.  His pain was treated with Norco while in the department.  The importance of compliance with his incentive spirometer was stressed to the patient.  Was able to pass p.o. challenge, and his pain was adequately controlled.    Since potassium was elevated, he was instructed to stop taking his lisinopril as this is most likely contributing.  No EKG changes from hyperkalemia.  Also he was informed that he needs to hold his metformin for 3 days as he received IV contrast today.  He was able to p.o. hydrate well in the department.  Strict follow-up with PCP is recommended.  Patient given information on fall prevention in the home.  Return precautions were discussed and patient states his understanding and is agreeable for discharge.  Patient was seen as a shared visit with Dr. Criss Alvine who evaluated the patient and agreed with plan.   Final Clinical Impressions(s) / ED Diagnoses   Final diagnoses:  Closed fracture of one rib of right side, initial encounter  Fall in home, initial encounter    ED Discharge Orders         Ordered    HYDROcodone-acetaminophen (NORCO/VICODIN) 5-325 MG tablet  Every 6 hours PRN     11/24/17 2254           Cristina Gong, PA-C 11/25/17 0154    Pricilla Loveless, MD 11/28/17 2235

## 2017-11-24 NOTE — Discharge Instructions (Addendum)
Please do not take your metformin for the next three days.  Please make sure you are drinking extra fluids.  Please stop your lisinopril.  Please call your primary care doctor for an appointment this week.   Your potassium was 5.3.  Your creatinine was 1.65 and your GFR was 40.  Please share these numbers with your primary care doctor.  Today you received medications that may make you sleepy or impair your ability to make decisions.  For the next 24 hours please do not drive, operate heavy machinery, care for a small child with out another adult present, or perform any activities that may cause harm to you or someone else if you were to fall asleep or be impaired.   You are being prescribed a medication which may make you sleepy. Please follow up of listed precautions for at least 24 hours after taking one dose.  Please take Ibuprofen (Advil, motrin) and Tylenol (acetaminophen) to relieve your pain.  You may take up to 400 MG (2 pills) of normal strength ibuprofen every 8 hours as needed.  In between doses of ibuprofen you make take tylenol, up to 1,000 mg (two extra strength pills).  Do not take more than 3,000 mg tylenol in a 24 hour period.  Please check all medication labels as many medications such as pain and cold medications may contain tylenol.  Do not drink alcohol while taking these medications.  Do not take other NSAID'S while taking ibuprofen (such as aleve or naproxen).  Please take ibuprofen with food to decrease stomach upset.

## 2017-11-24 NOTE — ED Triage Notes (Signed)
Pt arrives via [facility] following fall on front porch, fell backwards and hit back, c/o R lower rib pain that hurts with movement and inspiration.  Orthostatic for [facility], upon standing dropped to 80/50 with diaphoresis and dizzyness, resolved on sitting down. Up to 96/56, HR in 50's IV 18 R FA, AOx4

## 2017-11-24 NOTE — ED Notes (Signed)
Patient still feels unable to do orthostatic vitals, feeling pain still

## 2017-11-27 ENCOUNTER — Emergency Department: Payer: Medicare Other

## 2017-11-27 ENCOUNTER — Observation Stay
Admission: EM | Admit: 2017-11-27 | Discharge: 2017-11-29 | Disposition: A | Discharge status: Home / Self Care | Payer: Medicare Other | Attending: Internal Medicine | Admitting: Internal Medicine

## 2017-11-27 ENCOUNTER — Encounter: Payer: Self-pay | Admitting: Emergency Medicine

## 2017-11-27 DIAGNOSIS — Z880 Allergy status to penicillin: Secondary | ICD-10-CM | POA: Insufficient documentation

## 2017-11-27 DIAGNOSIS — S2231XA Fracture of one rib, right side, initial encounter for closed fracture: Secondary | ICD-10-CM | POA: Diagnosis not present

## 2017-11-27 DIAGNOSIS — Z79899 Other long term (current) drug therapy: Secondary | ICD-10-CM | POA: Insufficient documentation

## 2017-11-27 DIAGNOSIS — Z91030 Bee allergy status: Secondary | ICD-10-CM | POA: Insufficient documentation

## 2017-11-27 DIAGNOSIS — Z7982 Long term (current) use of aspirin: Secondary | ICD-10-CM | POA: Insufficient documentation

## 2017-11-27 DIAGNOSIS — I10 Essential (primary) hypertension: Secondary | ICD-10-CM | POA: Diagnosis present

## 2017-11-27 DIAGNOSIS — Z87891 Personal history of nicotine dependence: Secondary | ICD-10-CM | POA: Insufficient documentation

## 2017-11-27 DIAGNOSIS — Z794 Long term (current) use of insulin: Secondary | ICD-10-CM | POA: Diagnosis not present

## 2017-11-27 DIAGNOSIS — W1789XA Other fall from one level to another, initial encounter: Secondary | ICD-10-CM | POA: Insufficient documentation

## 2017-11-27 DIAGNOSIS — S2239XA Fracture of one rib, unspecified side, initial encounter for closed fracture: Secondary | ICD-10-CM | POA: Diagnosis present

## 2017-11-27 DIAGNOSIS — Z8249 Family history of ischemic heart disease and other diseases of the circulatory system: Secondary | ICD-10-CM | POA: Diagnosis not present

## 2017-11-27 DIAGNOSIS — E119 Type 2 diabetes mellitus without complications: Secondary | ICD-10-CM

## 2017-11-27 LAB — CBC WITH DIFFERENTIAL/PLATELET
Basophils Absolute: 0.1 10*3/uL (ref 0–0.1)
Basophils Relative: 1 %
EOS ABS: 0.1 10*3/uL (ref 0–0.7)
Eosinophils Relative: 1 %
HCT: 44.7 % (ref 40.0–52.0)
HEMOGLOBIN: 15.3 g/dL (ref 13.0–18.0)
LYMPHS ABS: 1.1 10*3/uL (ref 1.0–3.6)
Lymphocytes Relative: 8 %
MCH: 29.6 pg (ref 26.0–34.0)
MCHC: 34.3 g/dL (ref 32.0–36.0)
MCV: 86.3 fL (ref 80.0–100.0)
MONO ABS: 0.9 10*3/uL (ref 0.2–1.0)
MONOS PCT: 7 %
NEUTROS PCT: 83 %
Neutro Abs: 10.7 10*3/uL — ABNORMAL HIGH (ref 1.4–6.5)
Platelets: 228 10*3/uL (ref 150–440)
RBC: 5.18 MIL/uL (ref 4.40–5.90)
RDW: 13.7 % (ref 11.5–14.5)
WBC: 12.9 10*3/uL — ABNORMAL HIGH (ref 3.8–10.6)

## 2017-11-27 LAB — BASIC METABOLIC PANEL
Anion gap: 10 (ref 5–15)
BUN: 20 mg/dL (ref 8–23)
CHLORIDE: 99 mmol/L (ref 98–111)
CO2: 27 mmol/L (ref 22–32)
CREATININE: 1.18 mg/dL (ref 0.61–1.24)
Calcium: 9.6 mg/dL (ref 8.9–10.3)
GFR calc Af Amer: >60 mL/min (ref >60)
GFR calc non Af Amer: >60 mL/min (ref >60)
Glucose, Bld: 227 mg/dL — ABNORMAL HIGH (ref 70–99)
Potassium: 4.4 mmol/L (ref 3.5–5.1)
SODIUM: 136 mmol/L (ref 135–145)

## 2017-11-27 LAB — TROPONIN I

## 2017-11-27 MED ORDER — MORPHINE SULFATE (PF) 4 MG/ML IV SOLN
4.0000 mg | Freq: Once | INTRAVENOUS | Status: AC
  Administered 2017-11-27: 4 mg via INTRAVENOUS
  Filled 2017-11-27: qty 1

## 2017-11-27 MED ORDER — ONDANSETRON HCL 4 MG/2ML IJ SOLN
4.0000 mg | Freq: Once | INTRAMUSCULAR | Status: AC
  Administered 2017-11-27: 4 mg via INTRAVENOUS
  Filled 2017-11-27: qty 2

## 2017-11-27 MED ORDER — SODIUM CHLORIDE 0.9 % IV BOLUS
1000.0000 mL | Freq: Once | INTRAVENOUS | Status: AC
  Administered 2017-11-27: 1000 mL via INTRAVENOUS

## 2017-11-27 NOTE — ED Triage Notes (Signed)
Pt arrived via EMS from home with reports is pt had mechanical fall on 9/2, was seen and diagnosed with rib fracture to right side. Pt prescribed 5-325 hydrocodone and is to ED tonight for reevaluation due to pain management no established by medication.

## 2017-11-27 NOTE — ED Provider Notes (Signed)
[facility] Emergency Department Provider Note ____________________________________________   First MD Initiated Contact with Patient 11/27/17 2052     (approximate)  I have reviewed the triage vital signs and the nursing notes.   HISTORY  Chief Complaint Rib Injury  HPI Adam Irwin is a 72 y.o. male with a history of diabetes and hypertension with a recent fall off a porch and single rib 7 posterior fracture who is presenting emergency department today with worsening pain and shortness of breath.  He denies any cough or fever.  Says that he is using his incentive spirometer at home.  Was discharged with hydrocodone from [facility] which he says is not helping with his symptoms.   Past Medical History:  Diagnosis Date  . Diabetes mellitus without complication (HCC)   . Hypertension     There are no active problems to display for this patient.   Past Surgical History:  Procedure Laterality Date  . CATARACT EXTRACTION, BILATERAL      Prior to Admission medications   Medication Sig Start Date End Date Taking? Authorizing Provider  albuterol (PROVENTIL HFA;VENTOLIN HFA) 108 (90 Base) MCG/ACT inhaler Inhale 2 puffs into the lungs every 6 (six) hours as needed for wheezing or shortness of breath.    [provider]  amitriptyline (ELAVIL) 25 MG tablet Take 50 mg by mouth at bedtime. For pain    [provider]  amLODipine (NORVASC) 10 MG tablet Take 10 mg by mouth daily. For blood pressure    [provider]  aspirin 81 MG chewable tablet Chew 81 mg by mouth daily. For heart protection    [provider]  calcipotriene (DOVONOX) 0.005 % cream Apply 1 application topically See admin instructions. Apply sparingly topically two times a day as needed for rash, apply to active lesions only    [provider]  carboxymethylcellulose (REFRESH PLUS) 0.5 % SOLN Place 1 drop into both eyes 4 (four) times daily.     [provider]  DULoxetine (CYMBALTA) 60 MG capsule Take 60 mg by mouth daily.    [provider]  EPINEPHrine (EPIPEN 2-PAK) 0.3 mg/0.3 mL IJ SOAJ injection Inject 0.3 mg into the muscle once as needed (severe allergic reaction).    [provider]  glipiZIDE (GLUCOTROL) 5 MG tablet Take 5 mg by mouth 2 (two) times daily before a meal.    [provider]  hydrocerin (EUCERIN) CREA Apply 1 application topically daily. Apply liberal amount topically to dry skin after shower every day    [provider]  HYDROcodone-acetaminophen (NORCO/VICODIN) 5-325 MG tablet Take 1 tablet by mouth every 6 (six) hours as needed. 11/24/17   Cristina Gong, PA-C  ketoconazole (NIZORAL) 2 % shampoo Apply 1 application topically See admin instructions. Apply topically to chest and back and then rinse off - Monday, Wednesday, Friday    [provider]  levothyroxine (SYNTHROID, LEVOTHROID) 150 MCG tablet Take 150 mcg by mouth See admin instructions. Take one tablet (150 mcg) by mouth on Monday, Tuesday, Wednesday, Thursday, Friday (take 175 mcg on Saturday and Sunday) - for thyroid    [provider]  levothyroxine (SYNTHROID, LEVOTHROID) 175 MCG tablet Take 175 mcg by mouth See admin instructions. Take one tablet (175 mcg) by mouth every Saturday and Sunday (take 150 mcg Monday thru Friday) - for thyroid    [provider]  lisinopril (PRINIVIL,ZESTRIL) 40 MG tablet Take 40 mg by mouth daily. For blood pressure  [provider]  loratadine (CLARITIN) 10 MG tablet Take 10 mg by mouth daily.     [provider]  Melatonin 3 MG TABS Take 3 mg by mouth at bedtime.    [provider]  metFORMIN (GLUCOPHAGE) 1000 MG tablet Take 1,000 mg by mouth 2 (two) times daily with a meal. For diabetes    [provider]  metoprolol tartrate (LOPRESSOR) 50 MG tablet Take 50 mg by mouth 2 (two) times daily. For blood pressure     [provider]  mometasone (ASMANEX, 120 METERED DOSES,) 220 MCG/INH inhaler Inhale 1 puff into the lungs daily.     [provider]  omeprazole (PRILOSEC) 20 MG capsule Take 20 mg by mouth daily before breakfast. To control stomach acid    [provider]  pioglitazone (ACTOS) 45 MG tablet Take 45 mg by mouth daily. For diabetes mellitus    [provider]  Pramoxine-HC (HYDROCORTISONE ACE-PRAMOXINE EX) Apply 1 application topically 2 (two) times daily as needed (itchy spots). 2.5%/1% ointment per [facility]    [provider]  simvastatin (ZOCOR) 40 MG tablet Take 20 mg by mouth at bedtime. For cholesterol    [provider]  Tiotropium Bromide-Olodaterol (STIOLTO RESPIMAT) 2.5-2.5 MCG/ACT AERS Inhale 2 puffs into the lungs at bedtime.     [provider]    Allergies Bee venom and Penicillins  History reviewed. No pertinent family history.  Social History Social History   Tobacco Use  . Smoking status: Former Games developer  . Smokeless tobacco: Former Engineer, water Use Topics  . Alcohol use: No  . Drug use: Not on file    Review of Systems  Constitutional: No fever/chills Eyes: No visual changes. ENT: No sore throat. Cardiovascular: Denies chest pain. Respiratory: As above Gastrointestinal: No abdominal pain.  No nausea, no vomiting.  No diarrhea.  No constipation. Genitourinary: Negative for dysuria. Musculoskeletal: Negative for back pain. Skin: Negative for rash. Neurological: Negative for headaches, focal weakness or numbness.   ____________________________________________   PHYSICAL EXAM:  VITAL SIGNS: ED Triage Vitals  Enc Vitals Group     BP 11/27/17 2100 (!) 142/98     Pulse Rate 11/27/17 2100 (!) 127     Resp --      Temp 11/27/17 2100 98.3 F (36.8 C)     Temp Source 11/27/17 2100 Oral     SpO2 11/27/17 2100 92 %     Weight --      Height --      Head Circumference --      Peak Flow --      Pain  Score 11/27/17 2101 10     Pain Loc --      Pain Edu? --      Excl. in GC? --     Constitutional: Alert and oriented. Well appearing and in no acute distress. Eyes: Conjunctivae are normal.  Head: Atraumatic. Nose: No congestion/rhinnorhea. Mouth/Throat: Mucous membranes are moist.  Neck: No stridor.   Cardiovascular: Tachycardic, regular rhythm. Grossly normal heart sounds.  Tenderness to palpation of the right lateral as well as posterior thoracic wall.   Respiratory: Normal respiratory effort.  No retractions. Lungs CTAB. Gastrointestinal: Soft and nontender. No distention. Musculoskeletal: No lower extremity tenderness nor edema.  No joint effusions.  Right flank ecchymosis.  Large area of ecchymosis approximately 20 x 10 cm.  No overlying tenderness, crepitus, deformity.  Neurologic:  Normal speech and language. No gross focal neurologic deficits are appreciated.  Skin:  Skin is warm, dry and intact. No rash noted. Psychiatric: Mood and affect are normal. Speech and behavior are normal.  ____________________________________________   LABS (all labs ordered are listed, but only abnormal results are displayed)  Labs Reviewed  CBC WITH DIFFERENTIAL/PLATELET - Abnormal; Notable for the following components:      Result Value   WBC 12.9 (*)    Neutro Abs 10.7 (*)    All other components within normal limits  BASIC METABOLIC PANEL - Abnormal; Notable for the following components:   Glucose, Bld 227 (*)    All other components within normal limits  TROPONIN I   ____________________________________________  EKG  ED ECG REPORT I, Arelia Longest, the attending physician, personally viewed and interpreted this ECG.   Date: 11/27/2017  EKG Time: 2234  Rate: 115  Rhythm: sinus tachycardia  Axis: Normal  Intervals:none  ST&T Change: No ST segment elevation or depression.  No abnormal T wave inversion.  ____________________________________________  RADIOLOGY  Pending  chest x-ray at this time ____________________________________________   PROCEDURES  Procedure(s) performed:   Procedures  Critical Care performed:   ____________________________________________   INITIAL IMPRESSION / ASSESSMENT AND PLAN / ED COURSE  Pertinent labs & imaging results that were available during my care of the patient were reviewed by me and considered in my medical decision making (see chart for details).  Differential diagnosis includes, but is not limited to, ACS, aortic dissection, pulmonary embolism, cardiac tamponade, pneumothorax, pneumonia, pericarditis, myocarditis, GI-related causes including esophagitis/gastritis, and musculoskeletal chest wall pain.   As part of my medical decision making, I reviewed the following data within the electronic MEDICAL RECORD NUMBER Notes from prior ED visits  ----------------------------------------- 11:07 PM on 11/27/2017 -----------------------------------------  Patient heart rate improving.  Poor inspiratory effort on chest x-ray.  Pending radiology read.  Signed out to Dr. Don Perking.  Patient with recent rib fracture with pain not managed by p.o. pain meds.  Shallow respirations.  Tachycardic.  Patient receiving IV pain medication as well as fluids.  Patient to be reassessed.  If clinical improvement the patient may likely be discharged home.  However, I do feel the patient, if not feeling better and tachycardia not resolved is high risk for pneumonia because of the cell respirations and poor pain control and will require admission. ____________________________________________   FINAL CLINICAL IMPRESSION(S) / ED DIAGNOSES  Rib fracture.  Chest pain.  NEW MEDICATIONS STARTED DURING THIS VISIT:  New Prescriptions   No medications on file     Note:  This document was prepared using Dragon voice recognition software and may include unintentional dictation errors.     Myrna Blazer, MD 11/27/17 [phone]

## 2017-11-28 ENCOUNTER — Encounter: Payer: Self-pay | Admitting: Internal Medicine

## 2017-11-28 ENCOUNTER — Other Ambulatory Visit: Payer: Self-pay

## 2017-11-28 DIAGNOSIS — I10 Essential (primary) hypertension: Secondary | ICD-10-CM | POA: Diagnosis present

## 2017-11-28 DIAGNOSIS — E119 Type 2 diabetes mellitus without complications: Secondary | ICD-10-CM

## 2017-11-28 DIAGNOSIS — S2239XA Fracture of one rib, unspecified side, initial encounter for closed fracture: Secondary | ICD-10-CM | POA: Diagnosis present

## 2017-11-28 LAB — BASIC METABOLIC PANEL
Anion gap: 9 (ref 5–15)
BUN: 25 mg/dL — ABNORMAL HIGH (ref 8–23)
CHLORIDE: 101 mmol/L (ref 98–111)
CO2: 28 mmol/L (ref 22–32)
CREATININE: 1.25 mg/dL — AB (ref 0.61–1.24)
Calcium: 8.9 mg/dL (ref 8.9–10.3)
GFR calc Af Amer: >60 mL/min (ref >60)
GFR calc non Af Amer: 56 mL/min — ABNORMAL LOW (ref >60)
Glucose, Bld: 279 mg/dL — ABNORMAL HIGH (ref 70–99)
POTASSIUM: 4.4 mmol/L (ref 3.5–5.1)
SODIUM: 138 mmol/L (ref 135–145)

## 2017-11-28 LAB — CBC
HCT: 40.4 % (ref 40.0–52.0)
HEMOGLOBIN: 14.2 g/dL (ref 13.0–18.0)
MCH: 30.4 pg (ref 26.0–34.0)
MCHC: 35.1 g/dL (ref 32.0–36.0)
MCV: 86.5 fL (ref 80.0–100.0)
PLATELETS: 188 10*3/uL (ref 150–440)
RBC: 4.66 MIL/uL (ref 4.40–5.90)
RDW: 13.6 % (ref 11.5–14.5)
WBC: 8.2 10*3/uL (ref 3.8–10.6)

## 2017-11-28 LAB — GLUCOSE, CAPILLARY
GLUCOSE-CAPILLARY: 211 mg/dL — AB (ref 70–99)
GLUCOSE-CAPILLARY: 191 mg/dL — AB (ref 70–99)
Glucose-Capillary: 171 mg/dL — ABNORMAL HIGH (ref 70–99)
Glucose-Capillary: 184 mg/dL — ABNORMAL HIGH (ref 70–99)

## 2017-11-28 MED ORDER — OXYCODONE HCL ER 10 MG PO T12A
10.0000 mg | EXTENDED_RELEASE_TABLET | Freq: Two times a day (BID) | ORAL | Status: DC
  Administered 2017-11-28 – 2017-11-29 (×3): 10 mg via ORAL
  Filled 2017-11-28 (×3): qty 1

## 2017-11-28 MED ORDER — ACETAMINOPHEN 650 MG RE SUPP: 650.0000 mg | Freq: Four times a day (QID) | RECTAL | Status: DC | PRN

## 2017-11-28 MED ORDER — ONDANSETRON HCL 4 MG/2ML IJ SOLN: 4.0000 mg | Freq: Four times a day (QID) | INTRAMUSCULAR | Status: DC | PRN

## 2017-11-28 MED ORDER — SIMVASTATIN 20 MG PO TABS
20.0000 mg | ORAL_TABLET | Freq: Every day | ORAL | Status: DC
  Administered 2017-11-28: 20 mg via ORAL
  Filled 2017-11-28: qty 1

## 2017-11-28 MED ORDER — LEVOTHYROXINE SODIUM 50 MCG PO TABS
175.0000 ug | ORAL_TABLET | ORAL | Status: DC
  Administered 2017-11-29: 175 ug via ORAL
  Filled 2017-11-28: qty 1

## 2017-11-28 MED ORDER — LEVOTHYROXINE SODIUM 50 MCG PO TABS
150.0000 ug | ORAL_TABLET | ORAL | Status: DC
  Administered 2017-11-28: 150 ug via ORAL
  Filled 2017-11-28: qty 1

## 2017-11-28 MED ORDER — INSULIN ASPART 100 UNIT/ML ~~LOC~~ SOLN: 0.0000 [IU] | Freq: Every day | SUBCUTANEOUS | Status: DC

## 2017-11-28 MED ORDER — CYCLOBENZAPRINE HCL 10 MG PO TABS: 5.0000 mg | ORAL_TABLET | Freq: Three times a day (TID) | ORAL | Status: DC | PRN

## 2017-11-28 MED ORDER — METOPROLOL TARTRATE 50 MG PO TABS
50.0000 mg | ORAL_TABLET | Freq: Two times a day (BID) | ORAL | Status: DC
  Administered 2017-11-28 – 2017-11-29 (×3): 50 mg via ORAL
  Filled 2017-11-28 (×3): qty 1

## 2017-11-28 MED ORDER — HYDROCODONE-ACETAMINOPHEN 5-325 MG PO TABS
1.0000 | ORAL_TABLET | Freq: Four times a day (QID) | ORAL | Status: DC | PRN
  Administered 2017-11-28 – 2017-11-29 (×3): 1 via ORAL
  Filled 2017-11-28 (×4): qty 1

## 2017-11-28 MED ORDER — PANTOPRAZOLE SODIUM 40 MG PO TBEC
40.0000 mg | DELAYED_RELEASE_TABLET | Freq: Every day | ORAL | Status: DC
  Administered 2017-11-28 – 2017-11-29 (×2): 40 mg via ORAL
  Filled 2017-11-28 (×2): qty 1

## 2017-11-28 MED ORDER — SENNA 8.6 MG PO TABS
1.0000 | ORAL_TABLET | Freq: Two times a day (BID) | ORAL | Status: DC
  Administered 2017-11-28 – 2017-11-29 (×2): 8.6 mg via ORAL
  Filled 2017-11-28 (×2): qty 1

## 2017-11-28 MED ORDER — INSULIN ASPART 100 UNIT/ML ~~LOC~~ SOLN
0.0000 [IU] | Freq: Three times a day (TID) | SUBCUTANEOUS | Status: DC
  Administered 2017-11-28 (×2): 2 [IU] via SUBCUTANEOUS
  Administered 2017-11-28 – 2017-11-29 (×2): 3 [IU] via SUBCUTANEOUS
  Administered 2017-11-29: 2 [IU] via SUBCUTANEOUS
  Filled 2017-11-28 (×5): qty 1

## 2017-11-28 MED ORDER — ASPIRIN 81 MG PO CHEW
81.0000 mg | CHEWABLE_TABLET | Freq: Every day | ORAL | Status: DC
  Administered 2017-11-28 – 2017-11-29 (×2): 81 mg via ORAL
  Filled 2017-11-28 (×2): qty 1

## 2017-11-28 MED ORDER — DULOXETINE HCL 60 MG PO CPEP
60.0000 mg | ORAL_CAPSULE | Freq: Every day | ORAL | Status: DC
  Administered 2017-11-28 – 2017-11-29 (×2): 60 mg via ORAL
  Filled 2017-11-28 (×2): qty 1

## 2017-11-28 MED ORDER — ENOXAPARIN SODIUM 40 MG/0.4ML ~~LOC~~ SOLN
40.0000 mg | SUBCUTANEOUS | Status: DC
  Administered 2017-11-28: 40 mg via SUBCUTANEOUS
  Filled 2017-11-28: qty 0.4

## 2017-11-28 MED ORDER — AMLODIPINE BESYLATE 10 MG PO TABS
10.0000 mg | ORAL_TABLET | Freq: Every day | ORAL | Status: DC
  Administered 2017-11-28 – 2017-11-29 (×2): 10 mg via ORAL
  Filled 2017-11-28 (×2): qty 1

## 2017-11-28 MED ORDER — ACETAMINOPHEN 325 MG PO TABS: 650.0000 mg | ORAL_TABLET | Freq: Four times a day (QID) | ORAL | Status: DC | PRN

## 2017-11-28 MED ORDER — LEVOTHYROXINE SODIUM 50 MCG PO TABS: 175.0000 ug | ORAL_TABLET | ORAL | Status: DC

## 2017-11-28 MED ORDER — LEVOTHYROXINE SODIUM 50 MCG PO TABS: 150.0000 ug | ORAL_TABLET | ORAL | Status: DC

## 2017-11-28 MED ORDER — ONDANSETRON HCL 4 MG PO TABS: 4.0000 mg | ORAL_TABLET | Freq: Four times a day (QID) | ORAL | Status: DC | PRN

## 2017-11-28 MED ORDER — MORPHINE SULFATE (PF) 2 MG/ML IV SOLN
2.0000 mg | INTRAVENOUS | Status: DC | PRN
  Administered 2017-11-28: 2 mg via INTRAVENOUS
  Filled 2017-11-28: qty 1

## 2017-11-28 NOTE — H&P (Signed)
[facility] [place] [facility]   PATIENT NAME: Adam Irwin    MR#:  [id number]  DATE OF BIRTH:  1946-02-09  DATE OF ADMISSION:  11/27/2017  PRIMARY CARE PHYSICIAN: [facility]   REQUESTING/REFERRING PHYSICIAN: Pershing Proud, MD  CHIEF COMPLAINT:   Chief Complaint  Patient presents with  . Rib Injury    HISTORY OF PRESENT ILLNESS:  Arye Weyenberg  is a 72 y.o. male who presents with chief complaint as above.  Patient fell a couple of days ago and fractured a single rib.  He was sent home with hydrocodone and incentive spirometry.  He returns to the ED tonight with complaint of difficulty taking deep breaths due to his significant pain.  He he was given some medications for pain control in the ED and reassessed and still complains of significant pain.  Hospitalist were called for admission for pain control.  PAST MEDICAL HISTORY:   Past Medical History:  Diagnosis Date  . Diabetes mellitus without complication (HCC)   . Hypertension      PAST SURGICAL HISTORY:   Past Surgical History:  Procedure Laterality Date  . CATARACT EXTRACTION, BILATERAL       SOCIAL HISTORY:   Social History   Tobacco Use  . Smoking status: Former Games developer  . Smokeless tobacco: Former Engineer, water Use Topics  . Alcohol use: No     FAMILY HISTORY:   Family History  Problem Relation Age of Onset  . Asthma Father   . CAD Father   . Diabetes Father      DRUG ALLERGIES:   Allergies  Allergen Reactions  . Bee Venom Anaphylaxis  . Penicillins Anaphylaxis    Has patient had a PCN reaction causing immediate rash, facial/tongue/throat swelling, SOB or lightheadedness with hypotension: Yes Has patient had a PCN reaction causing severe rash involving mucus membranes or skin necrosis: Unknown Has patient had a PCN reaction that required hospitalization: Unknown Has patient had a PCN reaction occurring within the last 10 years: No If all of the  above answers are "NO", then may proceed with Cephalosporin use.    MEDICATIONS AT HOME:   Prior to Admission medications   Medication Sig Start Date End Date Taking? Authorizing Provider  albuterol (PROVENTIL HFA;VENTOLIN HFA) 108 (90 Base) MCG/ACT inhaler Inhale 2 puffs into the lungs every 6 (six) hours as needed for wheezing or shortness of breath.   Yes [provider]  amitriptyline (ELAVIL) 25 MG tablet Take 50 mg by mouth at bedtime. For pain   Yes [provider]  amLODipine (NORVASC) 10 MG tablet Take 10 mg by mouth daily. For blood pressure   Yes [provider]  aspirin 81 MG chewable tablet Chew 81 mg by mouth daily. For heart protection   Yes [provider]  calcipotriene (DOVONOX) 0.005 % cream Apply 1 application topically See admin instructions. Apply sparingly topically two times a day as needed for rash, apply to active lesions only   Yes [provider]  carboxymethylcellulose (REFRESH PLUS) 0.5 % SOLN Place 1 drop into both eyes 4 (four) times daily.   Yes [provider]  DULoxetine (CYMBALTA) 60 MG capsule Take 60 mg by mouth daily.   Yes [provider]  glipiZIDE (GLUCOTROL) 5 MG tablet Take 5 mg by mouth 2 (two) times daily before a meal.   Yes [provider]  hydrocerin (EUCERIN) CREA Apply 1 application topically daily. Apply liberal amount topically to dry skin  after shower every day   Yes [provider]  HYDROcodone-acetaminophen (NORCO/VICODIN) 5-325 MG tablet Take 1 tablet by mouth every 6 (six) hours as needed. 11/24/17  Yes Cristina Gong, PA-C  ketoconazole (NIZORAL) 2 % shampoo Apply 1 application topically See admin instructions. Apply topically to chest and back and then rinse off - Monday, Wednesday, Friday   Yes [provider]  levothyroxine (SYNTHROID, LEVOTHROID) 150 MCG tablet Take 150 mcg by mouth See admin instructions. Take one tablet (150 mcg) by mouth on  Monday, Tuesday, Wednesday, Thursday, Friday (take 175 mcg on Saturday and Sunday) - for thyroid   Yes [provider]  levothyroxine (SYNTHROID, LEVOTHROID) 175 MCG tablet Take 175 mcg by mouth See admin instructions. Take one tablet (175 mcg) by mouth every Saturday and Sunday (take 150 mcg Monday thru Friday) - for thyroid   Yes [provider]  lisinopril (PRINIVIL,ZESTRIL) 40 MG tablet Take 40 mg by mouth daily. For blood pressure   Yes [provider]  loratadine (CLARITIN) 10 MG tablet Take 10 mg by mouth daily.    Yes [provider]  Melatonin 3 MG TABS Take 3 mg by mouth at bedtime.   Yes [provider]  metFORMIN (GLUCOPHAGE) 1000 MG tablet Take 1,000 mg by mouth 2 (two) times daily with a meal. For diabetes   Yes [provider]  metoprolol tartrate (LOPRESSOR) 50 MG tablet Take 50 mg by mouth 2 (two) times daily. For blood pressure   Yes [provider]  mometasone (ASMANEX, 120 METERED DOSES,) 220 MCG/INH inhaler Inhale 1 puff into the lungs daily.    Yes [provider]  omeprazole (PRILOSEC) 20 MG capsule Take 20 mg by mouth daily before breakfast. To control stomach acid   Yes [provider]  pioglitazone (ACTOS) 45 MG tablet Take 45 mg by mouth daily. For diabetes mellitus   Yes [provider]  Pramoxine-HC (HYDROCORTISONE ACE-PRAMOXINE EX) Apply 1 application topically 2 (two) times daily as needed (itchy spots). 2.5%/1% ointment per [facility]   Yes [provider]  simvastatin (ZOCOR) 40 MG tablet Take 20 mg by mouth at bedtime. For cholesterol   Yes [provider]  Tiotropium Bromide-Olodaterol (STIOLTO RESPIMAT) 2.5-2.5 MCG/ACT AERS Inhale 2 puffs into the lungs at bedtime.    Yes [provider]  EPINEPHrine (EPIPEN 2-PAK) 0.3 mg/0.3 mL IJ SOAJ injection Inject 0.3 mg into the muscle once as needed (severe allergic reaction).    [provider]    REVIEW OF  SYSTEMS:  Review of Systems  Constitutional: Negative for chills, fever, malaise/fatigue and weight loss.  HENT: Negative for ear pain, hearing loss and tinnitus.   Eyes: Negative for blurred vision, double vision, pain and redness.  Respiratory: Negative for cough, hemoptysis and shortness of breath.   Cardiovascular: Negative for chest pain, palpitations, orthopnea and leg swelling.       Lateral thoracic pain  Gastrointestinal: Negative for abdominal pain, constipation, diarrhea, nausea and vomiting.  Genitourinary: Negative for dysuria, frequency and hematuria.  Musculoskeletal: Negative for back pain, joint pain and neck pain.  Skin:       No acne, rash, or lesions  Neurological: Negative for dizziness, tremors, focal weakness and weakness.  Endo/Heme/Allergies: Negative for polydipsia. Does not bruise/bleed easily.  Psychiatric/Behavioral: Negative for depression. The patient is not nervous/anxious and does not have insomnia.      VITAL SIGNS:   Vitals:   11/27/17 2100 11/27/17 2130  BP: (!) 142/98 Marland Kitchen)  143/93  Pulse: (!) 127 (!) 128  Temp: 98.3 F (36.8 C)   TempSrc: Oral   SpO2: 92% 93%   Wt Readings from Last 3 Encounters:  11/24/17 83.9 kg  12/25/15 90.7 kg    PHYSICAL EXAMINATION:  Physical Exam  Vitals reviewed. Constitutional: He is oriented to person, place, and time. He appears well-developed and well-nourished. No distress.  HENT:  Head: Normocephalic and atraumatic.  Mouth/Throat: Oropharynx is clear and moist.  Eyes: Pupils are equal, round, and reactive to light. Conjunctivae and EOM are normal. No scleral icterus.  Neck: Normal range of motion. Neck supple. No JVD present. No thyromegaly present.  Cardiovascular: Normal rate, regular rhythm and intact distal pulses. Exam reveals no gallop and no friction rub.  No murmur heard. Respiratory: Effort normal and breath sounds normal. No respiratory distress. He has no wheezes. He has no rales. He exhibits  tenderness (Right lateral tenderness to palpation).  GI: Soft. Bowel sounds are normal. He exhibits no distension. There is no tenderness.  Musculoskeletal: Normal range of motion. He exhibits no edema.  No arthritis, no gout  Lymphadenopathy:    He has no cervical adenopathy.  Neurological: He is alert and oriented to person, place, and time. No cranial nerve deficit.  No dysarthria, no aphasia  Skin: Skin is warm and dry. No rash noted. No erythema.  Psychiatric: He has a normal mood and affect. His behavior is normal. Judgment and thought content normal.    LABORATORY PANEL:   CBC Recent Labs  Lab 11/27/17 2158  WBC 12.9*  HGB 15.3  HCT 44.7  PLT 228   ------------------------------------------------------------------------------------------------------------------  Chemistries  Recent Labs  Lab 11/24/17 1610  11/27/17 2158  NA 136   < > 136  K 5.3*   < > 4.4  CL 103   < > 99  CO2 26  --  27  GLUCOSE 172*   < > 227*  BUN 14   < > 20  CREATININE 1.65*   < > 1.18  CALCIUM 9.1  --  9.6  AST 28  --   --   ALT 33  --   --   ALKPHOS 43  --   --   BILITOT 0.8  --   --    < > = values in this interval not displayed.   ------------------------------------------------------------------------------------------------------------------  Cardiac Enzymes Recent Labs  Lab 11/27/17 2158  TROPONINI <0.03   ------------------------------------------------------------------------------------------------------------------  RADIOLOGY:  Dg Chest 2 View  Result Date: 11/27/2017 CLINICAL DATA:  Acute chest pain. EXAM: CHEST - 2 VIEW COMPARISON:  11/24/2017 and prior radiographs FINDINGS: The cardiomediastinal silhouette is unremarkable. Mild bibasilar atelectasis/scarring again noted. There is no evidence of focal airspace disease, pulmonary edema, suspicious pulmonary nodule/mass, pleural effusion, or pneumothorax. No acute bony abnormalities are identified. IMPRESSION: Mild  bibasilar atelectasis/scarring. Electronically Signed   By: Harmon Pier M.D.   On: 11/27/2017 23:10    EKG:   Orders placed or performed during the hospital encounter of 11/27/17  . ED EKG  . ED EKG  . EKG 12-Lead  . EKG 12-Lead    IMPRESSION AND PLAN:  Principal Problem:   Rib fracture -PRN analgesia, incentive spirometry Active Problems:   HTN (hypertension) -continue home medications   Diabetes (HCC) -sliding scale insulin with corresponding glucose checks  Chart review performed and case discussed with ED provider. Labs, imaging and/or ECG reviewed by provider and discussed with patient/family. Management plans discussed with the patient and/or family.  DVT  PROPHYLAXIS: SubQ lovenox   GI PROPHYLAXIS:  None  ADMISSION STATUS: Observation  CODE STATUS: Full  TOTAL TIME TAKING CARE OF THIS PATIENT: 40 minutes.   Susannah Carbin FIELDING 11/28/2017, 3:19 AM  [facility]  Office  [phone]  CC: Primary care physician; [facility]  Note:  This document was prepared using Dragon voice recognition software and may include unintentional dictation errors.

## 2017-11-28 NOTE — Progress Notes (Signed)
[facility] [facility] [place] [facility]   PATIENT NAME: Adam Irwin    MR#:  [id number]  DATE OF BIRTH:  09-09-1945  SUBJECTIVE:  CHIEF COMPLAINT:   Chief Complaint  Patient presents with  . Rib Injury   Patient had an accidental fall few days ago and have the fracture.  Continue with persistent significant pain due to the fracture.  REVIEW OF SYSTEMS:  CONSTITUTIONAL: No fever, fatigue or weakness.  EYES: No blurred or double vision.  EARS, NOSE, AND THROAT: No tinnitus or ear pain.  RESPIRATORY: No cough, shortness of breath, wheezing or hemoptysis.  CARDIOVASCULAR: Right-sided chest pain, no orthopnea, edema.  GASTROINTESTINAL: No nausea, vomiting, diarrhea or abdominal pain.  GENITOURINARY: No dysuria, hematuria.  ENDOCRINE: No polyuria, nocturia,  HEMATOLOGY: No anemia, easy bruising or bleeding SKIN: No rash or lesion. MUSCULOSKELETAL: No joint pain or arthritis.   NEUROLOGIC: No tingling, numbness, weakness.  PSYCHIATRY: No anxiety or depression.   ROS  DRUG ALLERGIES:   Allergies  Allergen Reactions  . Bee Venom Anaphylaxis  . Penicillins Anaphylaxis    Has patient had a PCN reaction causing immediate rash, facial/tongue/throat swelling, SOB or lightheadedness with hypotension: Yes Has patient had a PCN reaction causing severe rash involving mucus membranes or skin necrosis: Unknown Has patient had a PCN reaction that required hospitalization: Unknown Has patient had a PCN reaction occurring within the last 10 years: No If all of the above answers are "NO", then may proceed with Cephalosporin use.    VITALS:  Blood pressure (!) 150/92, pulse 98, temperature 97.8 F (36.6 C), temperature source Oral, resp. rate 18, SpO2 93 %.  PHYSICAL EXAMINATION:  GENERAL:  72 y.o.-year-old patient lying in the bed with no acute distress.  EYES: Pupils equal, round, reactive to light and accommodation. No scleral icterus. Extraocular muscles intact.  HEENT:  Head atraumatic, normocephalic. Oropharynx and nasopharynx clear.  NECK:  Supple, no jugular venous distention. No thyroid enlargement, no tenderness.  LUNGS: Normal breath sounds bilaterally, no wheezing, rales,rhonchi or crepitation. No use of accessory muscles of respiration.  CARDIOVASCULAR: S1, S2 normal. No murmurs, rubs, or gallops.  Tender on local pressure on the chest. ABDOMEN: Soft, nontender, nondistended. Bowel sounds present. No organomegaly or mass.  EXTREMITIES: No pedal edema, cyanosis, or clubbing.  NEUROLOGIC: Cranial nerves II through XII are intact. Muscle strength 5/5 in all extremities. Sensation intact. Gait not checked.  PSYCHIATRIC: The patient is alert and oriented x 3.  SKIN: No obvious rash, lesion, or ulcer.   Physical Exam LABORATORY PANEL:   CBC Recent Labs  Lab 11/28/17 0459  WBC 8.2  HGB 14.2  HCT 40.4  PLT 188   ------------------------------------------------------------------------------------------------------------------  Chemistries  Recent Labs  Lab 11/24/17 1610  11/28/17 0459  NA 136   < > 138  K 5.3*   < > 4.4  CL 103   < > 101  CO2 26   < > 28  GLUCOSE 172*   < > 279*  BUN 14   < > 25*  CREATININE 1.65*   < > 1.25*  CALCIUM 9.1   < > 8.9  AST 28  --   --   ALT 33  --   --   ALKPHOS 43  --   --   BILITOT 0.8  --   --    < > = values in this interval not displayed.   ------------------------------------------------------------------------------------------------------------------  Cardiac Enzymes Recent Labs  Lab 11/27/17 2158  TROPONINI <0.03   ------------------------------------------------------------------------------------------------------------------  RADIOLOGY:  Dg Chest 2 View  Result Date: 11/27/2017 CLINICAL DATA:  Acute chest pain. EXAM: CHEST - 2 VIEW COMPARISON:  11/24/2017 and prior radiographs FINDINGS: The cardiomediastinal silhouette is unremarkable. Mild bibasilar atelectasis/scarring again noted.  There is no evidence of focal airspace disease, pulmonary edema, suspicious pulmonary nodule/mass, pleural effusion, or pneumothorax. No acute bony abnormalities are identified. IMPRESSION: Mild bibasilar atelectasis/scarring. Electronically Signed   By: Harmon Pier M.D.   On: 11/27/2017 23:10    ASSESSMENT AND PLAN:   Principal Problem:   Rib fracture Active Problems:   HTN (hypertension)   Diabetes (HCC)   *  Rib fracture -PRN analgesia, incentive spirometry Added long-acting OxyContin to help pain control. *  HTN (hypertension) -continue home medications *  Diabetes (HCC) -sliding scale insulin with corresponding glucose checks    All the records are reviewed and case discussed with Care Management/Social Workerr. Management plans discussed with the patient, family and they are in agreement.  CODE STATUS: Full.  TOTAL TIME TAKING CARE OF THIS PATIENT: 35 minutes.    POSSIBLE D/C IN 1-2 DAYS, DEPENDING ON CLINICAL CONDITION.   Altamese Dilling M.D on 11/28/2017   Between 7am to 6pm - Pager - [phone]  After 6pm go to [email or web address] - password [email or web address]  [facility]  Office  [phone]  CC: Primary care physician; [facility]  Note: This dictation was prepared with Dragon dictation along with smaller phrase technology. Any transcriptional errors that result from this process are unintentional.

## 2017-11-28 NOTE — Progress Notes (Signed)
IS education complete, pt tol fairly well with inspire of .

## 2017-11-28 NOTE — Care Management Obs Status (Signed)
MEDICARE OBSERVATION STATUS NOTIFICATION   Patient Details  Name: CORDARRELL OLASCOAGA MRN: [id number] Date of Birth: 1945/10/26   Medicare Observation Status Notification Given:  Yes    Collie Siad, RN 11/28/2017, 1:31 PM

## 2017-11-28 NOTE — Care Management Note (Signed)
Case Management Note  Patient Details  Name: Adam Irwin MRN: [id number] Date of Birth: 25-Jan-1946  Subjective/Objective:                  RNCM met with patient and a friend Eddie Dibbles. 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 Eddie Dibbles is getting ready to take her some food.    Action/Plan: MOON explained and questions answered.   Expected Discharge Date:  11/30/17               Expected Discharge Plan:     In-House Referral:     Discharge planning Services     Post Acute Care Choice:    Choice offered to:     DME Arranged:    DME Agency:     HH Arranged:    HH Agency:     Status of Service:     If discussed at [employer], dates discussed:    Additional Comments:  Marshell Garfinkel, RN 11/28/2017, 1:25 PM

## 2017-11-29 LAB — GLUCOSE, CAPILLARY
GLUCOSE-CAPILLARY: 167 mg/dL — AB (ref 70–99)
GLUCOSE-CAPILLARY: 233 mg/dL — AB (ref 70–99)

## 2017-11-29 MED ORDER — HYDROCODONE-ACETAMINOPHEN 5-325 MG PO TABS: 1.0000 | ORAL_TABLET | Freq: Four times a day (QID) | ORAL | 0 refills | Status: AC | PRN

## 2017-11-29 MED ORDER — MORPHINE SULFATE ER 15 MG PO TBCR: 15.0000 mg | EXTENDED_RELEASE_TABLET | Freq: Two times a day (BID) | ORAL | 0 refills | Status: AC

## 2017-11-29 NOTE — Progress Notes (Signed)
Advanced care plan.  Purpose of the Encounter: CODE STATUS  Parties in Attendance: Patient himself  Patient's Decision Capacity: Intact  Subjective/Patient's story: Patient 72 year old presented after a fall noted to have rib fracture history of hypertension diabetes   Objective/Medical story  Discussed with the patient regarding his desires for cardiac and pulmonary resuscitation  Goals of care determination: Full code    CODE STATUS:  Full code  Time spent discussing advanced care planning: 16 minutes

## 2017-11-29 NOTE — Care Management Note (Signed)
Case Management Note  Patient Details  Name: Adam Irwin MRN: [id number] Date of Birth: Aug 24, 1945  Subjective/Objective:  Patient to be discharged per MD order. Orders in place for home health services. Patient agreeable to services. Patient has no preference of agency, referral placed with Jerm[facility]. [facility] and aide services. Patient has a walker in home. No further DME needs. Family to provide transport. Buddy Duty RN BSN RNCM [phone]                   Action/Plan:   Expected Discharge Date:  11/29/17               Expected Discharge Plan:  Home w Home Health Services  In-House Referral:     Discharge planning Services  CM Consult  Post Acute Care Choice:  Home Health Choice offered to:  Patient  DME Arranged:    DME Agency:     HH Arranged:  PT, Nurse's Aide HH Agency:  [facility]  Status of Service:  Completed, signed off  If discussed at Long Length of Stay Meetings, dates discussed:    Additional Comments:  Virgel Manifold, RN 11/29/2017, 11:51 AM

## 2017-11-29 NOTE — Discharge Summary (Signed)
[facility] [place] [facility], 72 y.o., DOB 14-Oct-1945, MRN [id number]. Admission date: 11/27/2017 Discharge Date 11/29/2017 [facility] Physician Oralia Manis, MD  Admission Diagnosis  Closed fracture of one rib of right side, initial encounter [S22.31XA]  Discharge Diagnosis   Principal Problem:   Acute nondisplaced right posterior seventh rib fracture   HTN (hypertension)   Diabetes [facility])          Hospital Course Patient 72 year old presented with complaint of rib pain.  Patient was seen emergency room after he had fallen off a porch and had a single rib 7 fracture.  Patient was discharged home came back with worsening pain therefore he was placed under adequate observation given some pain medications his pain is doing much better and is stable for discharge.          Consults  None  Significant Tests:  See full reports for all details     Dg Chest 1 View  Result Date: 11/24/2017 CLINICAL DATA:  Fall with right-sided rib pain EXAM: CHEST  1 VIEW COMPARISON:  12/26/2015 FINDINGS: No acute consolidation or effusion. Borderline cardiomegaly with aortic atherosclerosis. No pneumothorax. Partially visualized old fracture deformity of the proximal right humerus. IMPRESSION: No active disease. Electronically Signed   By: Jasmine Pang M.D.   On: 11/24/2017 16:49   Dg Chest 2 View  Result Date: 11/27/2017 CLINICAL DATA:  Acute chest pain. EXAM: CHEST - 2 VIEW COMPARISON:  11/24/2017 and prior radiographs FINDINGS: The cardiomediastinal silhouette is unremarkable. Mild bibasilar atelectasis/scarring again noted. There is no evidence of focal airspace disease, pulmonary edema, suspicious pulmonary nodule/mass, pleural effusion, or pneumothorax. No acute bony abnormalities are identified. IMPRESSION: Mild bibasilar atelectasis/scarring. Electronically Signed   By: Harmon Pier M.D.   On: 11/27/2017 23:10   Ct Head Wo  Contrast  Result Date: 11/24/2017 CLINICAL DATA:  72 year old male with history of trauma from a fall off of this porch landing onto the right side. Right-sided shoulder pain. No definite head injury. EXAM: CT HEAD WITHOUT CONTRAST CT CERVICAL SPINE WITHOUT CONTRAST TECHNIQUE: Multidetector CT imaging of the head and cervical spine was performed following the standard protocol without intravenous contrast. Multiplanar CT image reconstructions of the cervical spine were also generated. COMPARISON:  Head CT 12/26/2015. FINDINGS: CT HEAD FINDINGS Brain: No evidence of acute infarction, hemorrhage, hydrocephalus, extra-axial collection or mass lesion/mass effect. Vascular: No hyperdense vessel or unexpected calcification. Skull: Normal. Negative for fracture or focal lesion. Sinuses/Orbits: No acute finding. Other: None. CT CERVICAL SPINE FINDINGS Alignment: Normal. Skull base and vertebrae: No acute fracture. No primary bone lesion or focal pathologic process. Soft tissues and spinal canal: No prevertebral fluid or swelling. No visible canal hematoma. Disc levels: Multilevel degenerative disc disease, most pronounced at C5-C6. Mild multilevel facet arthropathy. Upper chest: Unremarkable. Other: None. IMPRESSION: 1. No evidence of significant acute traumatic injury to the skull, brain or cervical spine. 2. The appearance of the brain is normal. 3. Mild multilevel degenerative disc disease and cervical spondylosis. Electronically Signed   By: Trudie Reed M.D.   On: 11/24/2017 18:03   Ct Chest W Contrast  Result Date: 11/24/2017 CLINICAL DATA:  Larey Seat from porch and landed on right side, right shoulder pain and arm pain EXAM: CT CHEST, ABDOMEN, AND PELVIS WITH CONTRAST TECHNIQUE: Multidetector CT imaging of the chest, abdomen and pelvis was performed following the standard protocol during bolus administration of intravenous contrast. CONTRAST:  ISOVUE-300 IOPAMIDOL (  ISOVUE-300) INJECTION 61% COMPARISON:   Radiograph 11/24/2017 FINDINGS: CT CHEST FINDINGS Cardiovascular: Mild aortic atherosclerosis. No aneurysm. Heart size upper normal. No pericardial effusion Mediastinum/Nodes: Negative for mediastinal hematoma. No thyroid mass. Midline trachea. No significant adenopathy. Esophagus within normal limits Lungs/Pleura: No pneumothorax. Subsegmental atelectasis in the right lower lobe. No significant pleural effusion. No focal consolidations. Minimal right posterior pleural thickening. Musculoskeletal: Acute nondisplaced right seventh posterior rib fracture. Sternum is intact. CT ABDOMEN PELVIS FINDINGS Hepatobiliary: No focal liver abnormality is seen. No gallstones, gallbladder wall thickening, or biliary dilatation. Pancreas: Unremarkable. No pancreatic ductal dilatation or surrounding inflammatory changes. Spleen: Normal in size without focal abnormality. Adrenals/Urinary Tract: Adrenal glands are within normal limits. No hydronephrosis. Bladder normal. Stomach/Bowel: Stomach is within normal limits. Appendix appears normal. No evidence of bowel wall thickening, distention, or inflammatory changes. Vascular/Lymphatic: Mild aortic atherosclerosis. No aneurysm. No significant adenopathy. Reproductive: Prostate is unremarkable. Other: Negative for free air or free fluid. Fat within the left greater than right inguinal canal. Fat in the umbilical region. Musculoskeletal: Degenerative changes. No acute or suspicious abnormality. IMPRESSION: 1. Acute nondisplaced right posterior seventh rib fracture with adjacent mild pleural thickening but no pneumothorax or significant pleural effusion. 2. Negative for acute mediastinal injury. 3. No CT evidence for acute intra-abdominal or pelvic abnormality. Electronically Signed   By: Jasmine Pang M.D.   On: 11/24/2017 18:09   Ct Cervical Spine Wo Contrast  Result Date: 11/24/2017 CLINICAL DATA:  72 year old male with history of trauma from a fall off of this porch landing onto  the right side. Right-sided shoulder pain. No definite head injury. EXAM: CT HEAD WITHOUT CONTRAST CT CERVICAL SPINE WITHOUT CONTRAST TECHNIQUE: Multidetector CT imaging of the head and cervical spine was performed following the standard protocol without intravenous contrast. Multiplanar CT image reconstructions of the cervical spine were also generated. COMPARISON:  Head CT 12/26/2015. FINDINGS: CT HEAD FINDINGS Brain: No evidence of acute infarction, hemorrhage, hydrocephalus, extra-axial collection or mass lesion/mass effect. Vascular: No hyperdense vessel or unexpected calcification. Skull: Normal. Negative for fracture or focal lesion. Sinuses/Orbits: No acute finding. Other: None. CT CERVICAL SPINE FINDINGS Alignment: Normal. Skull base and vertebrae: No acute fracture. No primary bone lesion or focal pathologic process. Soft tissues and spinal canal: No prevertebral fluid or swelling. No visible canal hematoma. Disc levels: Multilevel degenerative disc disease, most pronounced at C5-C6. Mild multilevel facet arthropathy. Upper chest: Unremarkable. Other: None. IMPRESSION: 1. No evidence of significant acute traumatic injury to the skull, brain or cervical spine. 2. The appearance of the brain is normal. 3. Mild multilevel degenerative disc disease and cervical spondylosis. Electronically Signed   By: Trudie Reed M.D.   On: 11/24/2017 18:03   Ct Abdomen Pelvis W Contrast  Result Date: 11/24/2017 CLINICAL DATA:  Larey Seat from porch and landed on right side, right shoulder pain and arm pain EXAM: CT CHEST, ABDOMEN, AND PELVIS WITH CONTRAST TECHNIQUE: Multidetector CT imaging of the chest, abdomen and pelvis was performed following the standard protocol during bolus administration of intravenous contrast. CONTRAST:  ISOVUE-300 IOPAMIDOL (ISOVUE-300) INJECTION 61% COMPARISON:  Radiograph 11/24/2017 FINDINGS: CT CHEST FINDINGS Cardiovascular: Mild aortic atherosclerosis. No aneurysm. Heart size upper  normal. No pericardial effusion Mediastinum/Nodes: Negative for mediastinal hematoma. No thyroid mass. Midline trachea. No significant adenopathy. Esophagus within normal limits Lungs/Pleura: No pneumothorax. Subsegmental atelectasis in the right lower lobe. No significant pleural effusion. No focal consolidations. Minimal right posterior pleural thickening. Musculoskeletal: Acute nondisplaced right seventh posterior rib fracture. Sternum is intact. CT  ABDOMEN PELVIS FINDINGS Hepatobiliary: No focal liver abnormality is seen. No gallstones, gallbladder wall thickening, or biliary dilatation. Pancreas: Unremarkable. No pancreatic ductal dilatation or surrounding inflammatory changes. Spleen: Normal in size without focal abnormality. Adrenals/Urinary Tract: Adrenal glands are within normal limits. No hydronephrosis. Bladder normal. Stomach/Bowel: Stomach is within normal limits. Appendix appears normal. No evidence of bowel wall thickening, distention, or inflammatory changes. Vascular/Lymphatic: Mild aortic atherosclerosis. No aneurysm. No significant adenopathy. Reproductive: Prostate is unremarkable. Other: Negative for free air or free fluid. Fat within the left greater than right inguinal canal. Fat in the umbilical region. Musculoskeletal: Degenerative changes. No acute or suspicious abnormality. IMPRESSION: 1. Acute nondisplaced right posterior seventh rib fracture with adjacent mild pleural thickening but no pneumothorax or significant pleural effusion. 2. Negative for acute mediastinal injury. 3. No CT evidence for acute intra-abdominal or pelvic abnormality. Electronically Signed   By: Jasmine Pang M.D.   On: 11/24/2017 18:09       Today   Subjective:   Adam Irwin patient doing much better denies any complaint  Objective:   Blood pressure (!) 143/72, pulse 70, temperature 97.8 F (36.6 C), temperature source Oral, resp. rate 19, height 5\' 7"  (1.702 m), SpO2 93 %.  .  Intake/Output  Summary (Last 24 hours) at 11/29/2017 1237 Last data filed at 11/29/2017 0411 Gross per 24 hour  Intake 240 ml  Output 280 ml  Net -40 ml    Exam VITAL SIGNS: Blood pressure (!) 143/72, pulse 70, temperature 97.8 F (36.6 C), temperature source Oral, resp. rate 19, height 5\' 7"  (1.702 m), SpO2 93 %.  GENERAL:  72 y.o.-year-old patient lying in the bed with no acute distress.  EYES: Pupils equal, round, reactive to light and accommodation. No scleral icterus. Extraocular muscles intact.  HEENT: Head atraumatic, normocephalic. Oropharynx and nasopharynx clear.  NECK:  Supple, no jugular venous distention. No thyroid enlargement, no tenderness.  LUNGS: Normal breath sounds bilaterally, no wheezing, rales,rhonchi or crepitation. No use of accessory muscles of respiration.  CARDIOVASCULAR: S1, S2 normal. No murmurs, rubs, or gallops.  ABDOMEN: Soft, nontender, nondistended. Bowel sounds present. No organomegaly or mass.  EXTREMITIES: No pedal edema, cyanosis, or clubbing.  NEUROLOGIC: Cranial nerves II through XII are intact. Muscle strength 5/5 in all extremities. Sensation intact. Gait not checked.  PSYCHIATRIC: The patient is alert and oriented x 3.  SKIN: No obvious rash, lesion, or ulcer.   Data Review     CBC w Diff:  Lab Results  Component Value Date   WBC 8.2 11/28/2017   HGB 14.2 11/28/2017   HCT 40.4 11/28/2017   PLT 188 11/28/2017   LYMPHOPCT 8 11/27/2017   MONOPCT 7 11/27/2017   EOSPCT 1 11/27/2017   BASOPCT 1 11/27/2017   CMP:  Lab Results  Component Value Date   NA 138 11/28/2017   K 4.4 11/28/2017   CL 101 11/28/2017   CO2 28 11/28/2017   BUN 25 (H) 11/28/2017   CREATININE 1.25 (H) 11/28/2017   PROT 6.0 (L) 11/24/2017   ALBUMIN 3.3 (L) 11/24/2017   BILITOT 0.8 11/24/2017   ALKPHOS 43 11/24/2017   AST 28 11/24/2017   ALT 33 11/24/2017  .  Micro Results No results found for this or any previous visit (from the past 240 hour(s)).      Code Status  Orders  (From admission, onward)         Start     Ordered   11/28/17 0420  Full code  Continuous  11/28/17 0419        Code Status History    This patient has a current code status but no historical code status.    Advance Directive Documentation     Most Recent Value  Type of Advance Directive  Living will  Pre-existing out of facility DNR order (yellow form or pink MOST form)  -  "MOST" Form in Place?  -            Discharge Medications   Allergies as of 11/29/2017      Reactions   Bee Venom Anaphylaxis   Penicillins Anaphylaxis   Has patient had a PCN reaction causing immediate rash, facial/tongue/throat swelling, SOB or lightheadedness with hypotension: Yes Has patient had a PCN reaction causing severe rash involving mucus membranes or skin necrosis: Unknown Has patient had a PCN reaction that required hospitalization: Unknown Has patient had a PCN reaction occurring within the last 10 years: No If all of the above answers are "NO", then may proceed with Cephalosporin use.      Medication List    STOP taking these medications   pioglitazone 45 MG tablet Commonly known as:  ACTOS     TAKE these medications   albuterol 108 (90 Base) MCG/ACT inhaler Commonly known as:  PROVENTIL HFA;VENTOLIN HFA Inhale 2 puffs into the lungs every 6 (six) hours as needed for wheezing or shortness of breath.   amitriptyline 25 MG tablet Commonly known as:  ELAVIL Take 50 mg by mouth at bedtime. For pain   amLODipine 10 MG tablet Commonly known as:  NORVASC Take 10 mg by mouth daily. For blood pressure   ASMANEX (120 METERED DOSES) 220 MCG/INH inhaler Generic drug:  mometasone Inhale 1 puff into the lungs daily.   aspirin 81 MG chewable tablet Chew 81 mg by mouth daily. For heart protection   calcipotriene 0.005 % cream Commonly known as:  DOVONOX Apply 1 application topically See admin instructions. Apply sparingly topically two times a day as needed for rash,  apply to active lesions only   carboxymethylcellulose 0.5 % Soln Commonly known as:  REFRESH PLUS Place 1 drop into both eyes 4 (four) times daily.   DULoxetine 60 MG capsule Commonly known as:  CYMBALTA Take 60 mg by mouth daily.   EPIPEN 2-PAK 0.3 mg/0.3 mL Soaj injection Generic drug:  EPINEPHrine Inject 0.3 mg into the muscle once as needed (severe allergic reaction).   glipiZIDE 5 MG tablet Commonly known as:  GLUCOTROL Take 5 mg by mouth 2 (two) times daily before a meal.   hydrocerin Crea Apply 1 application topically daily. Apply liberal amount topically to dry skin after shower every day   HYDROcodone-acetaminophen 5-325 MG tablet Commonly known as:  NORCO/VICODIN Take 1 tablet by mouth every 6 (six) hours as needed for up to 5 days.   HYDROCORTISONE ACE-PRAMOXINE EX Apply 1 application topically 2 (two) times daily as needed (itchy spots). 2.5%/1% ointment per [facility]   ketoconazole 2 % shampoo Commonly known as:  NIZORAL Apply 1 application topically See admin instructions. Apply topically to chest and back and then rinse off - Monday, Wednesday, Friday   levothyroxine 150 MCG tablet Commonly known as:  SYNTHROID, LEVOTHROID Take 150 mcg by mouth See admin instructions. Take one tablet (150 mcg) by mouth on Monday, Tuesday, Wednesday, Thursday, Friday (take 175 mcg on Saturday and Sunday) - for thyroid What changed:  Another medication with the same name was removed. Continue taking this medication, and follow the  directions you see here.   lisinopril 40 MG tablet Commonly known as:  PRINIVIL,ZESTRIL Take 40 mg by mouth daily. For blood pressure   loratadine 10 MG tablet Commonly known as:  CLARITIN Take 10 mg by mouth daily.   Melatonin 3 MG Tabs Take 3 mg by mouth at bedtime.   metFORMIN 1000 MG tablet Commonly known as:  GLUCOPHAGE Take 1,000 mg by mouth 2 (two) times daily with a meal. For diabetes   metoprolol tartrate 50 MG tablet Commonly known as:   LOPRESSOR Take 50 mg by mouth 2 (two) times daily. For blood pressure   morphine 15 MG 12 hr tablet Commonly known as:  MS CONTIN Take 1 tablet (15 mg total) by mouth every 12 (twelve) hours for 5 days.   omeprazole 20 MG capsule Commonly known as:  PRILOSEC Take 20 mg by mouth daily before breakfast. To control stomach acid   simvastatin 40 MG tablet Commonly known as:  ZOCOR Take 20 mg by mouth at bedtime. For cholesterol   STIOLTO RESPIMAT 2.5-2.5 MCG/ACT Aers Generic drug:  Tiotropium Bromide-Olodaterol Inhale 2 puffs into the lungs at bedtime.          Total Time in preparing paper work, data evaluation and todays exam - 35 minutes  Auburn Bilberry M.D on 11/29/2017 at 12:37 PM [facility]   Office  [phone]

## 2020-03-18 IMAGING — CT CT CHEST W/ CM
3 of 5 series · 13 of 36 positions shown, 16 images · IV contrast (APPLIED)
Comparison: Radiograph 11/24/2017

CLINICAL DATA: Fell from porch and landed on right side, right
shoulder pain and arm pain

EXAM:
CT CHEST, ABDOMEN, AND PELVIS WITH CONTRAST
TECHNIQUE: Multidetector CT imaging of the chest, abdomen and pelvis was
performed following the standard protocol during bolus
administration of intravenous contrast.
CONTRAST:  100mL OZMXE7-YII IOPAMIDOL (OZMXE7-YII) INJECTION 61%

[Series 3: cap 5.0 i31f 2 · axial · 0.67mm/px · z∈[-828,-318]mm · 9 of 128 slices shown, 12 images]
[im 13/128  mediastinal]
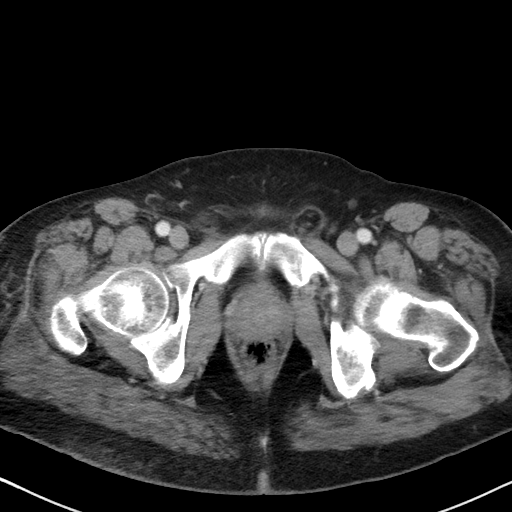
[im 13/128  lung]
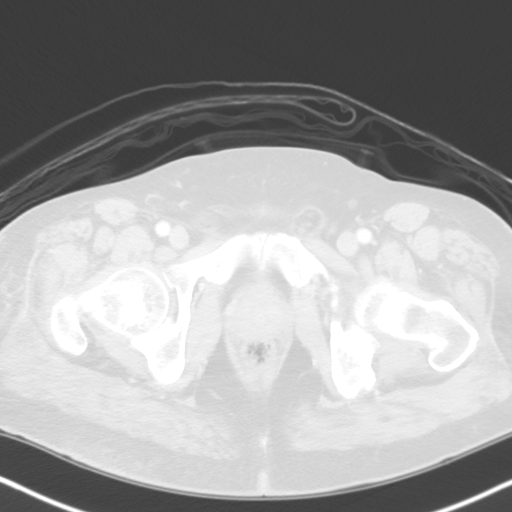
[im 26/128  lung]
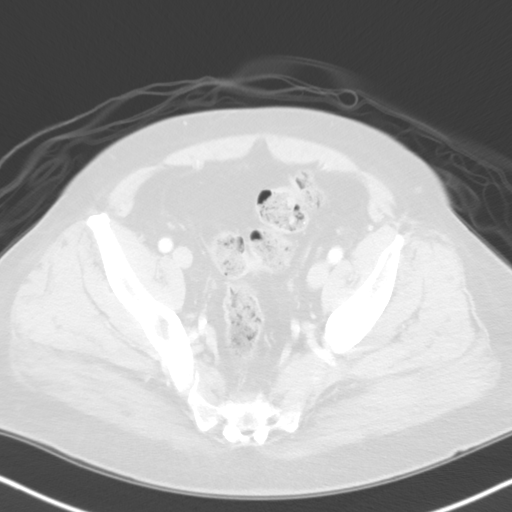
[im 39/128  lung]
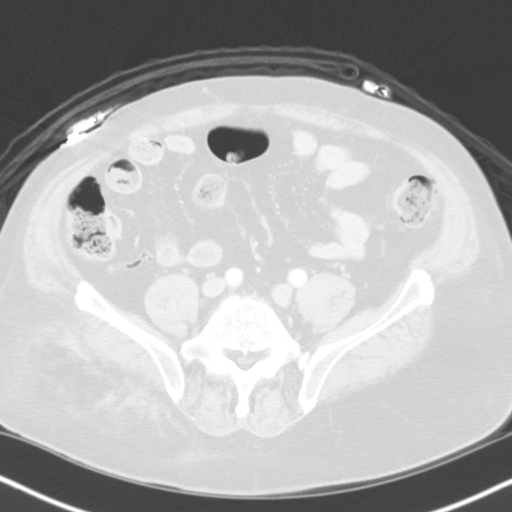
[im 51/128  lung]
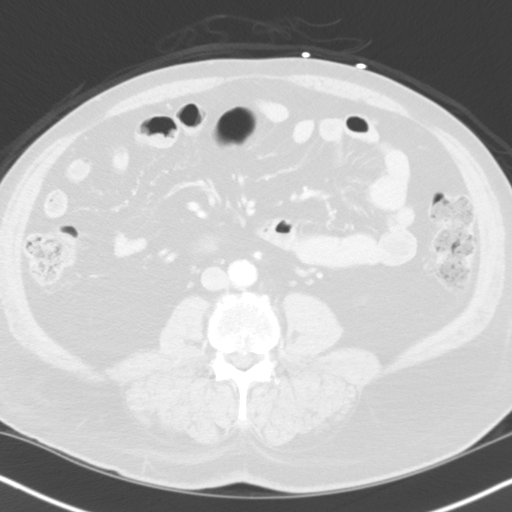
[im 64/128  mediastinal]
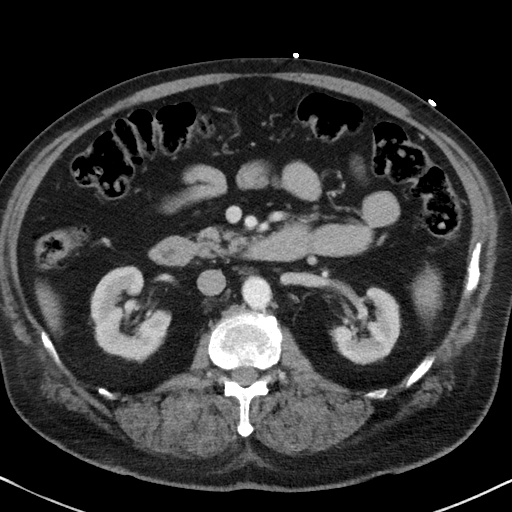
[im 64/128  lung]
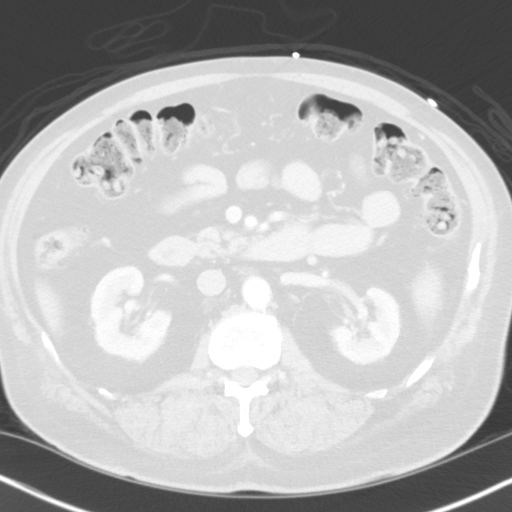
[im 77/128  lung]
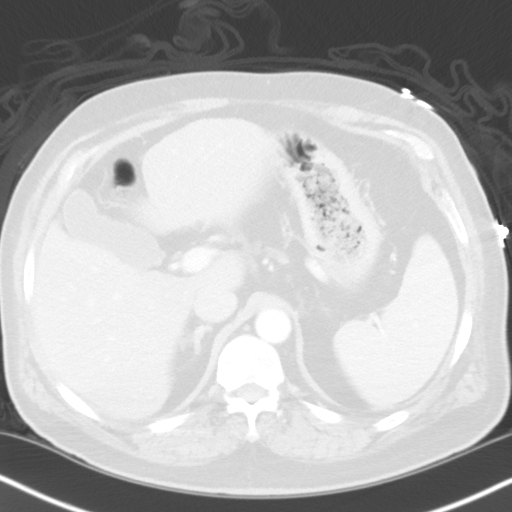
[im 89/128  lung]
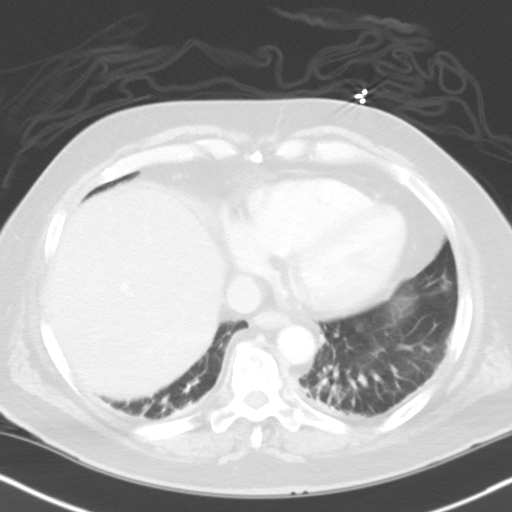
[im 102/128  lung]
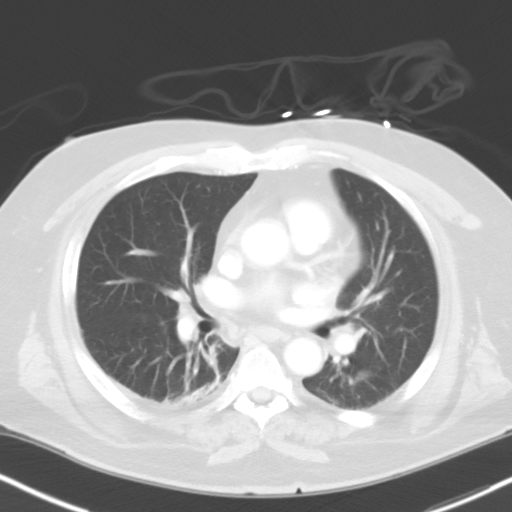
[im 115/128  mediastinal]
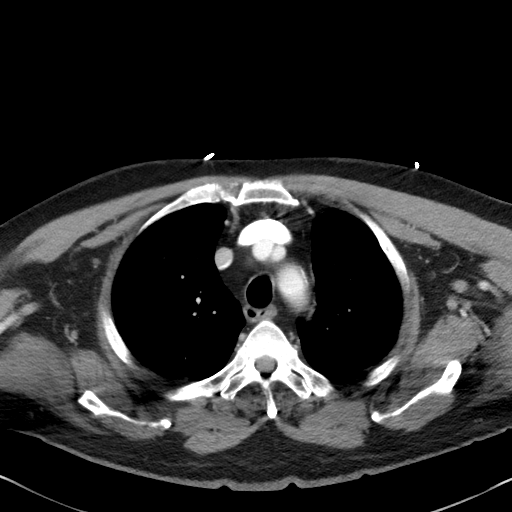
[im 115/128  lung]
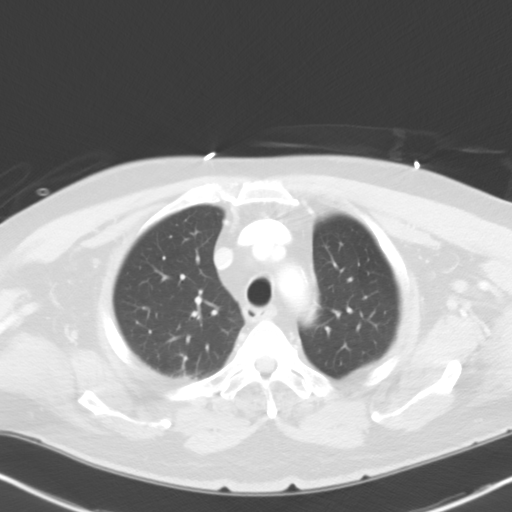

[Series 4: lungs · axial · 0.67mm/px · 1 of 141 slices shown]
[im 12/141  lung]
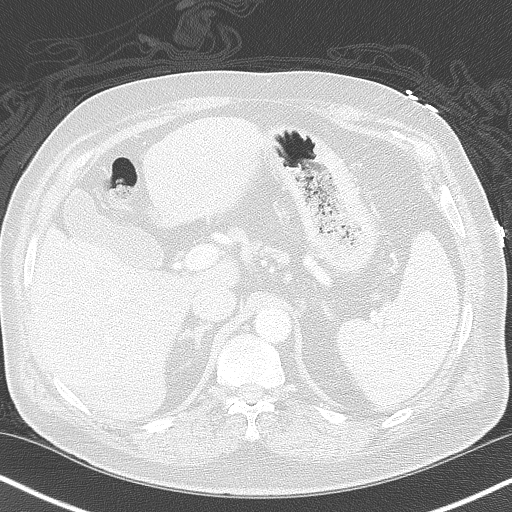

[Series 6: coronal · coronal · 0.74mm/px · 3 of 151 slices shown]
[im 31/151  lung]
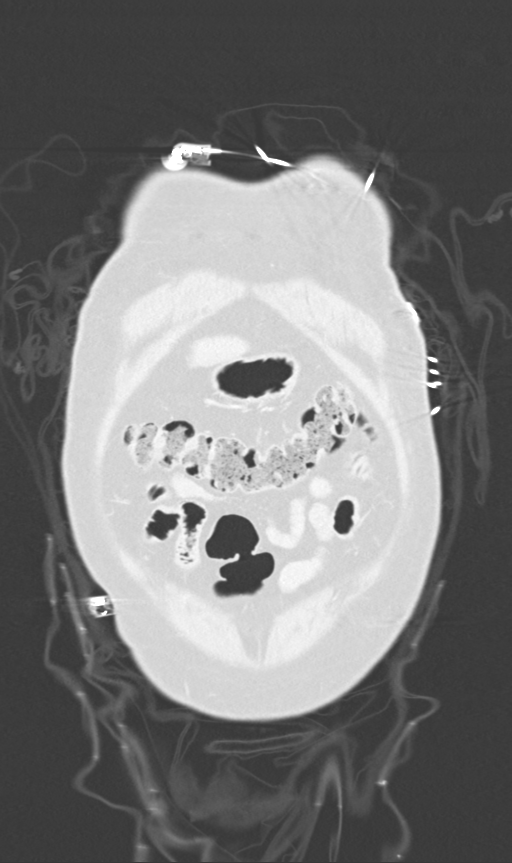
[im 61/151  lung]
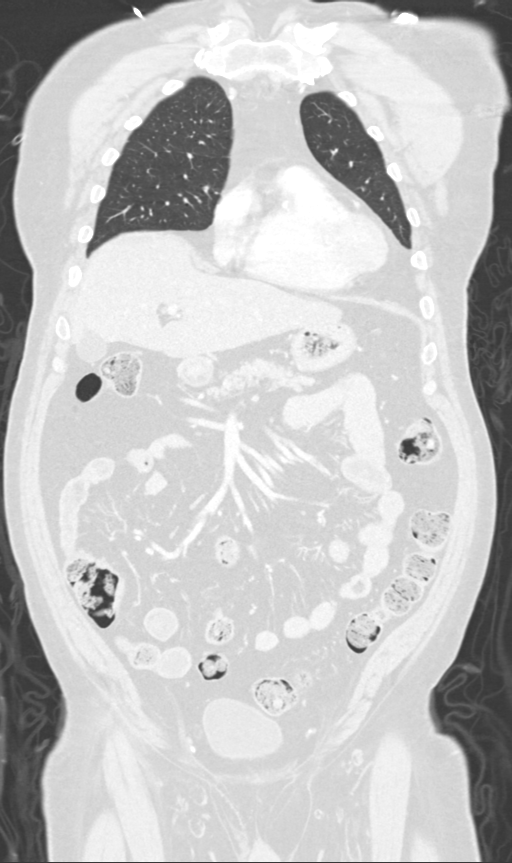
[im 91/151  lung]
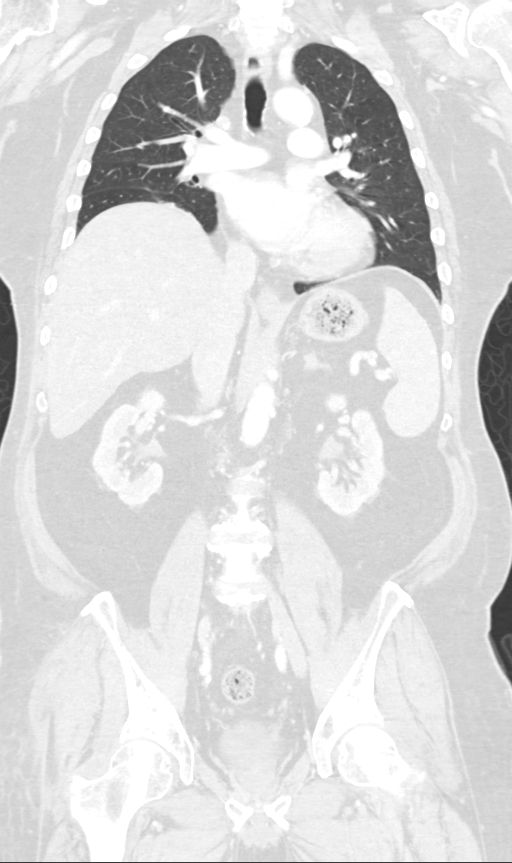

[13 of 36 positions shown; findings below may reference images not displayed]

FINDINGS: CT CHEST FINDINGS

Cardiovascular: Mild aortic atherosclerosis. No aneurysm. Heart size
upper normal. No pericardial effusion

Mediastinum/Nodes: Negative for mediastinal hematoma. No thyroid
mass. Midline trachea. No significant adenopathy. Esophagus within
normal limits

Lungs/Pleura: No pneumothorax. Subsegmental atelectasis in the right
lower lobe. No significant pleural effusion. No focal
consolidations. Minimal right posterior pleural thickening.

Musculoskeletal: Acute nondisplaced right seventh posterior rib
fracture. Sternum is intact.

CT ABDOMEN PELVIS FINDINGS

Hepatobiliary: No focal liver abnormality is seen. No gallstones,
gallbladder wall thickening, or biliary dilatation.

Pancreas: Unremarkable. No pancreatic ductal dilatation or
surrounding inflammatory changes.

Spleen: Normal in size without focal abnormality.

Adrenals/Urinary Tract: Adrenal glands are within normal limits. No
hydronephrosis. Bladder normal.

Stomach/Bowel: Stomach is within normal limits. Appendix appears
normal. No evidence of bowel wall thickening, distention, or
inflammatory changes.

Vascular/Lymphatic: Mild aortic atherosclerosis. No aneurysm. No
significant adenopathy.

Reproductive: Prostate is unremarkable.

Other: Negative for free air or free fluid. Fat within the left
greater than right inguinal canal. Fat in the umbilical region.

Musculoskeletal: Degenerative changes. No acute or suspicious
abnormality.
IMPRESSION: 1. Acute nondisplaced right posterior seventh rib fracture with
adjacent mild pleural thickening but no pneumothorax or significant
pleural effusion.
2. Negative for acute mediastinal injury.
3. No CT evidence for acute intra-abdominal or pelvic abnormality.

## 2020-03-18 IMAGING — DX DG CHEST 1V
1 series · 1 of 1 positions shown · non-contrast
Comparison: 12/26/2015

CLINICAL DATA: Fall with right-sided rib pain

EXAM:
CHEST  1 VIEW

[chest ap]
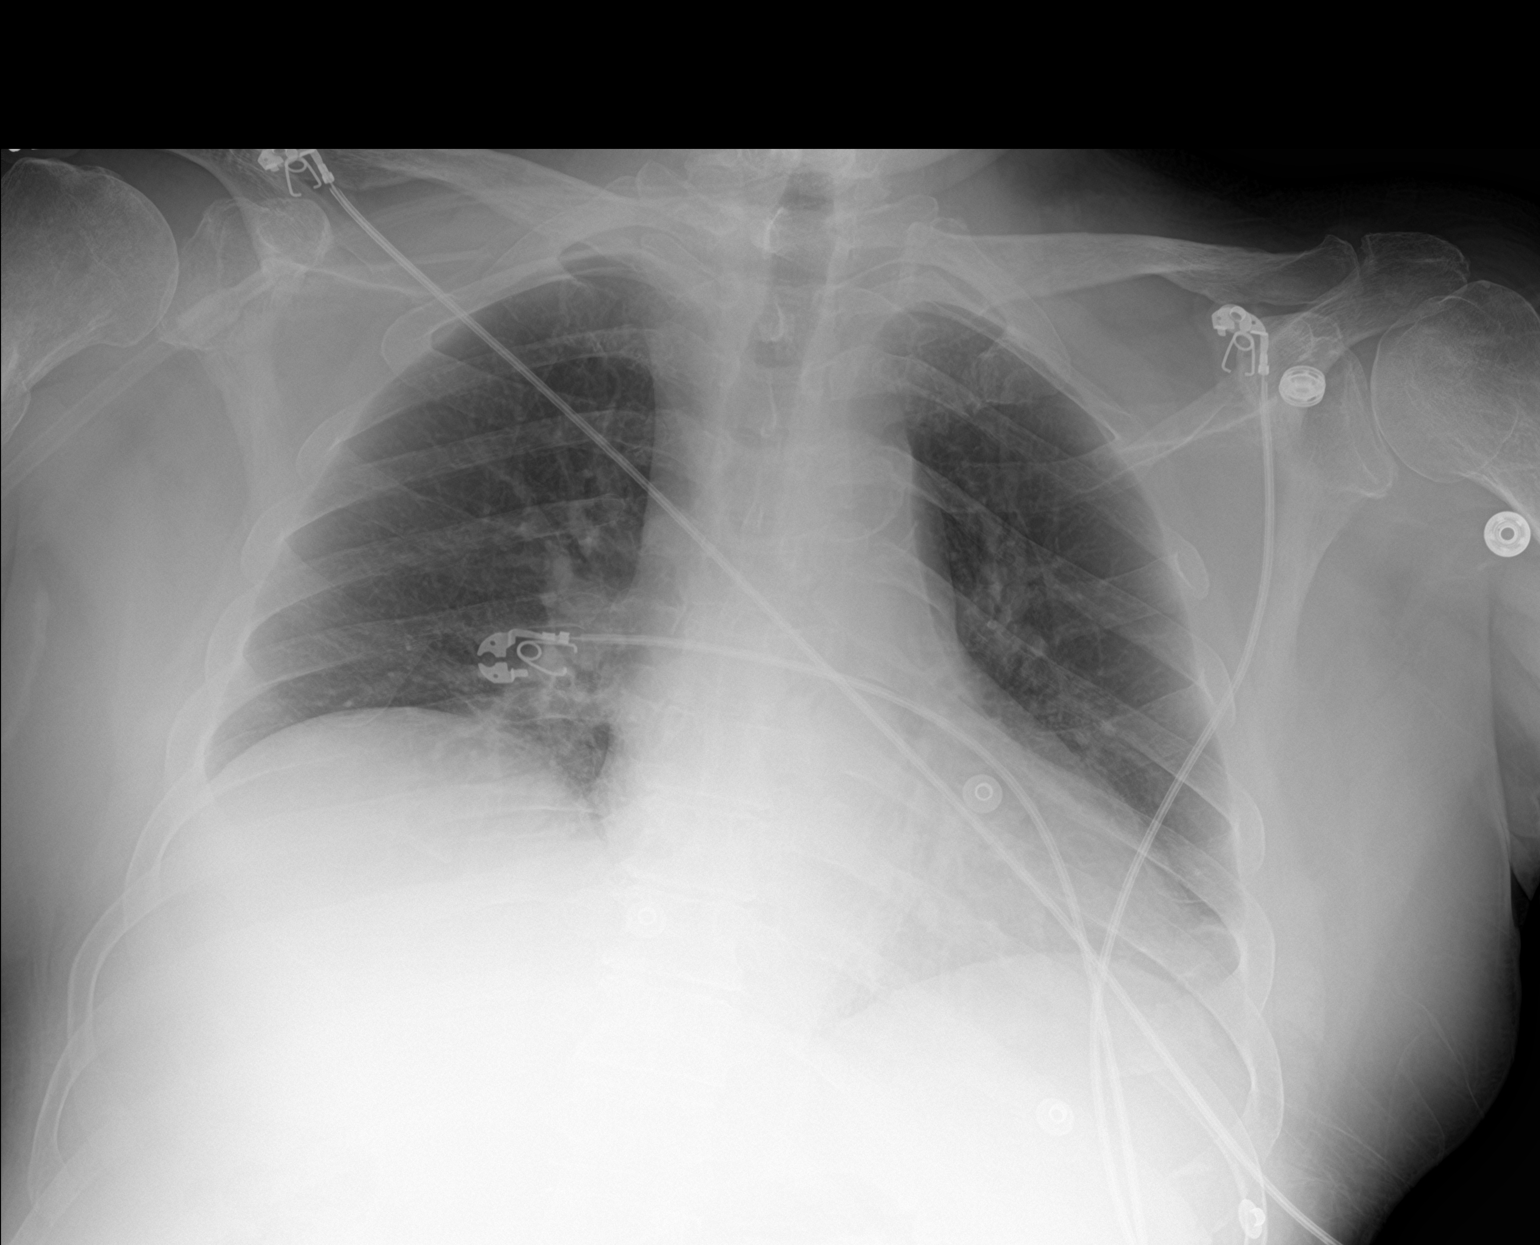

[1 of 1 positions shown; findings below may reference images not displayed]

FINDINGS: No acute consolidation or effusion. Borderline cardiomegaly with
aortic atherosclerosis. No pneumothorax. Partially visualized old
fracture deformity of the proximal right humerus.
IMPRESSION: No active disease.

## 2020-03-21 IMAGING — CR DG CHEST 2V
1 series · 2 of 2 positions shown · non-contrast
Comparison: 11/24/2017 and prior radiographs

CLINICAL DATA: Acute chest pain.

EXAM:
CHEST - 2 VIEW

[Series 1: dg chest 2 view · 0.14mm/px · 2 of 2 slices shown]
[im 1/2]
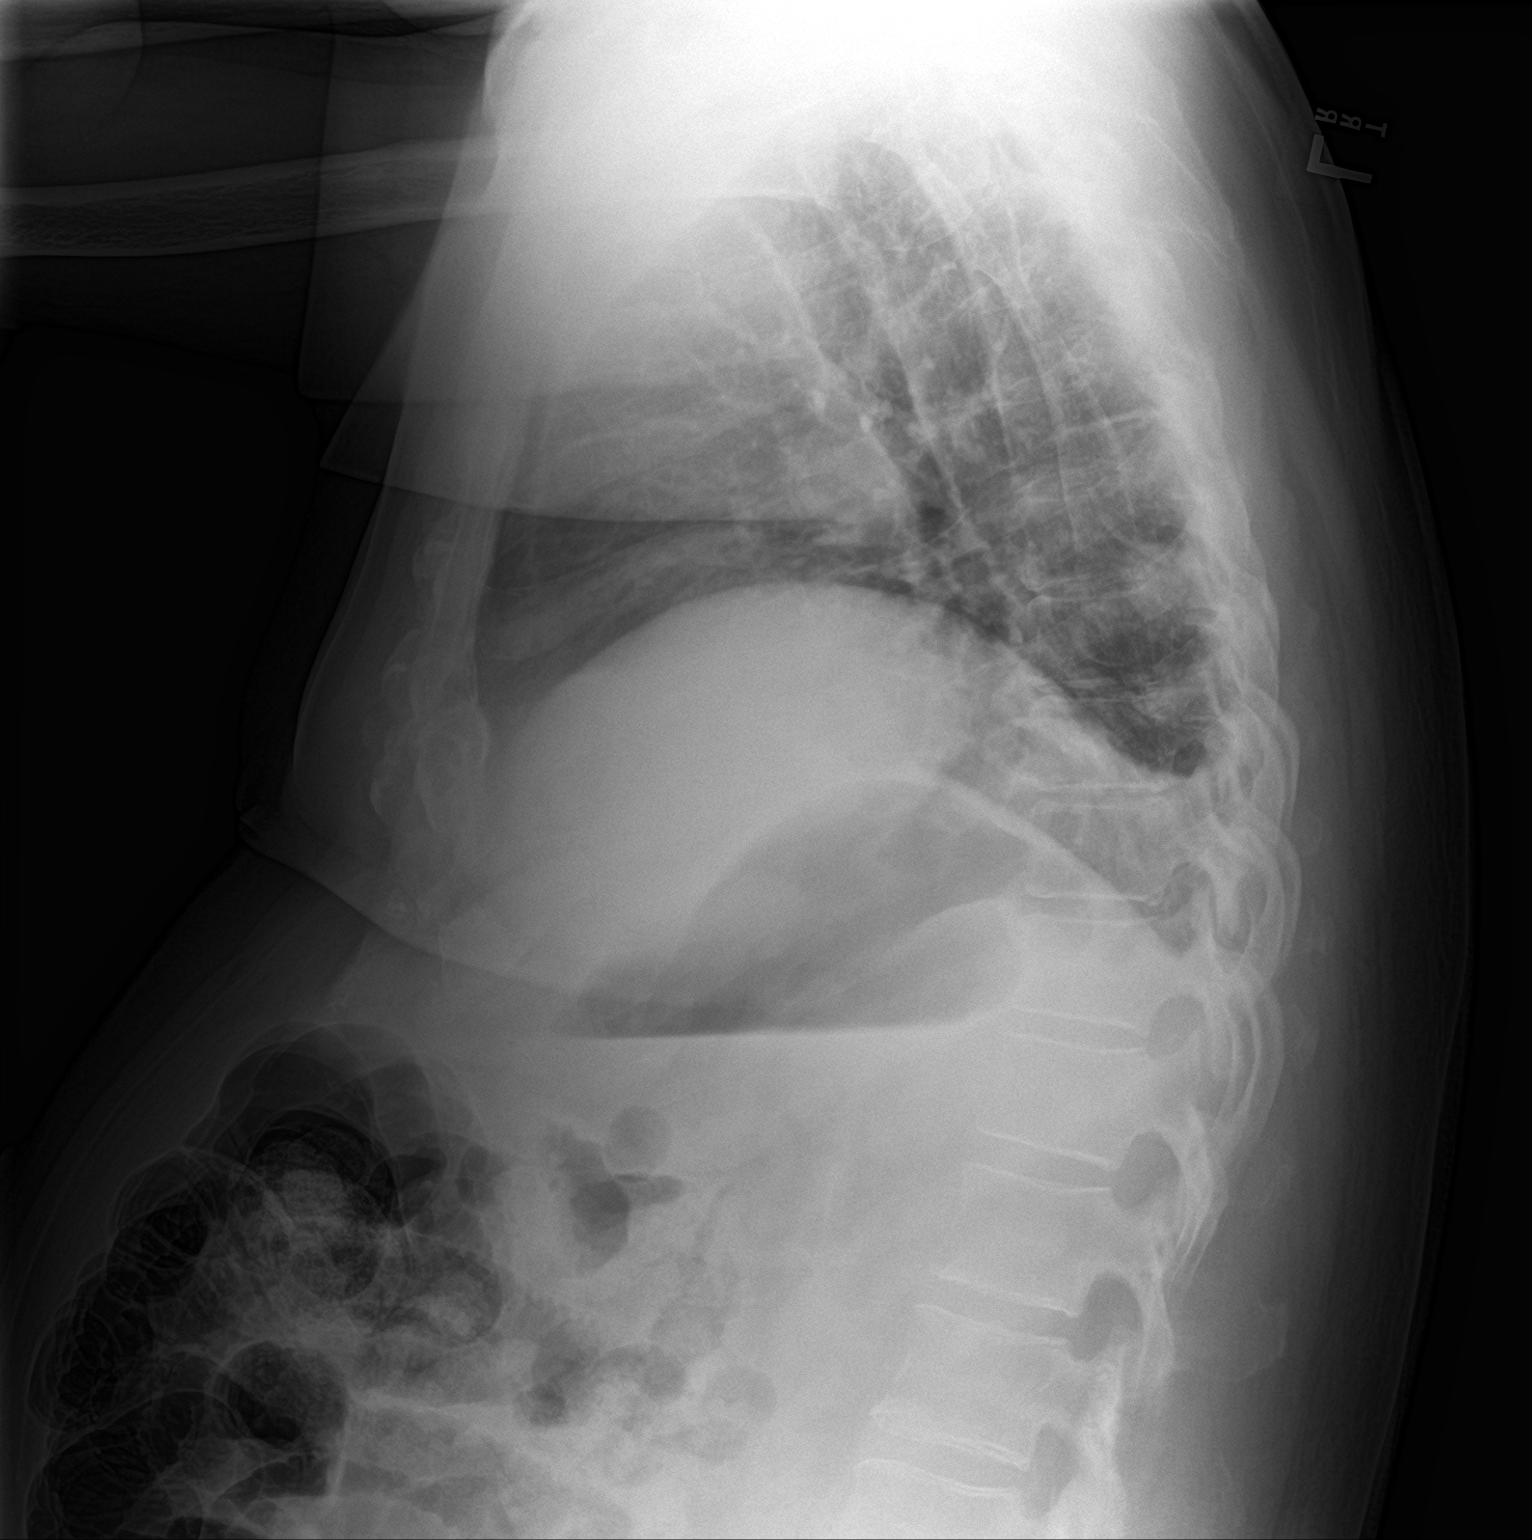
[im 2/2]
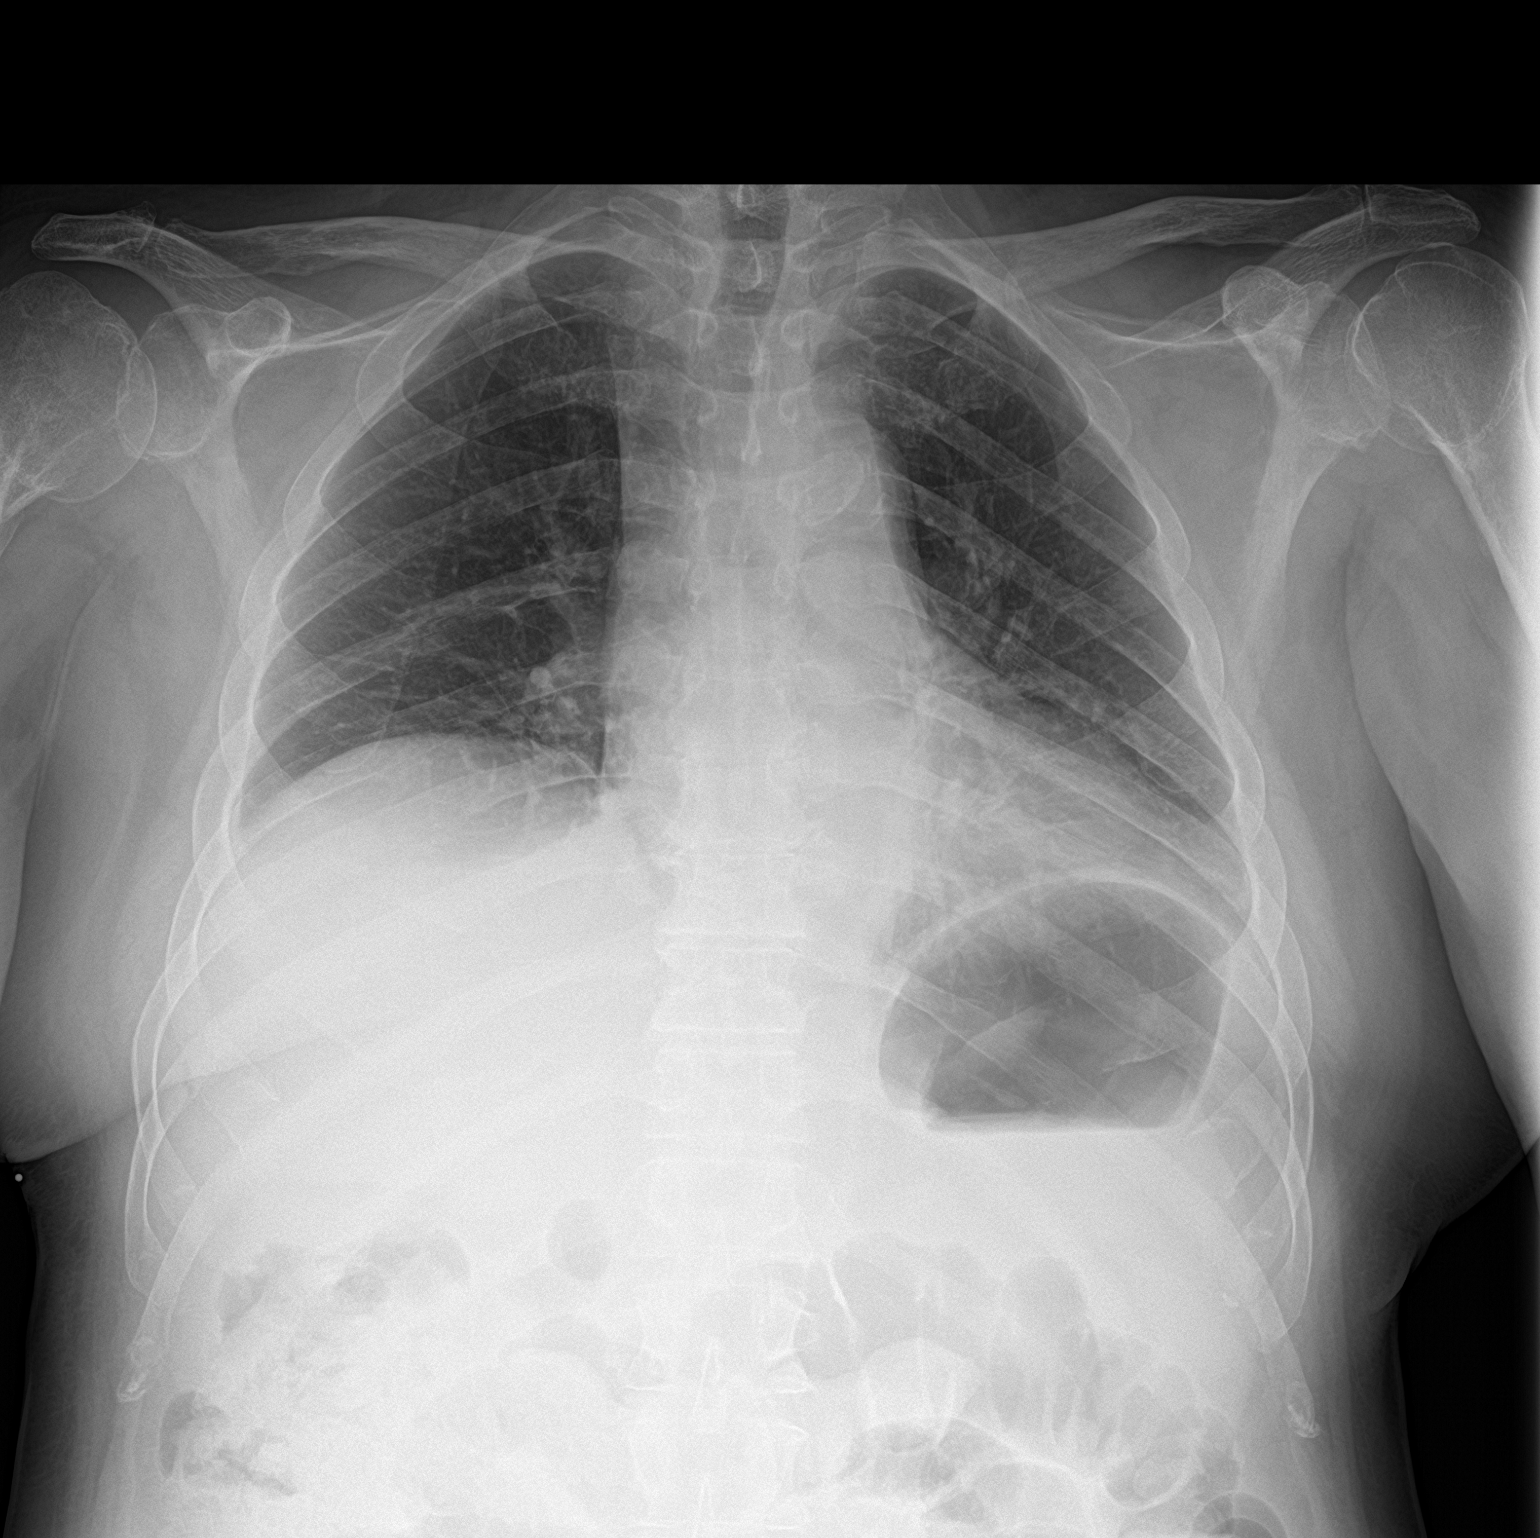

[2 of 2 positions shown; findings below may reference images not displayed]

FINDINGS: The cardiomediastinal silhouette is unremarkable.

Mild bibasilar atelectasis/scarring again noted.

There is no evidence of focal airspace disease, pulmonary edema,
suspicious pulmonary nodule/mass, pleural effusion, or pneumothorax.

No acute bony abnormalities are identified.
IMPRESSION: Mild bibasilar atelectasis/scarring.
